# Patient Record
Sex: Male | Born: 1980 | Race: Black or African American | Hispanic: No | Marital: Married | Smoking: Never smoker
Health system: Southern US, Community
[De-identification: ages and names within clinical notes are randomized; demographics above are authoritative.]

## PROBLEM LIST (undated history)

## (undated) DIAGNOSIS — T8859XA Other complications of anesthesia, initial encounter: Secondary | ICD-10-CM

## (undated) DIAGNOSIS — B019 Varicella without complication: Secondary | ICD-10-CM

## (undated) DIAGNOSIS — T7840XA Allergy, unspecified, initial encounter: Secondary | ICD-10-CM

## (undated) DIAGNOSIS — R51 Headache: Secondary | ICD-10-CM

## (undated) HISTORY — DX: Headache: R51

## (undated) HISTORY — PX: WISDOM TOOTH EXTRACTION: SHX21

## (undated) HISTORY — DX: Varicella without complication: B01.9

## (undated) HISTORY — DX: Allergy, unspecified, initial encounter: T78.40XA

## (undated) HISTORY — PX: KNEE ARTHROSCOPY W/ ACL RECONSTRUCTION: SHX1858

---

## 2004-03-21 ENCOUNTER — Emergency Department (HOSPITAL_COMMUNITY): Admission: EM | Admit: 2004-03-21 | Discharge: 2004-03-21 | Payer: Self-pay | Admitting: Emergency Medicine

## 2004-11-06 ENCOUNTER — Encounter: Admission: RE | Admit: 2004-11-06 | Discharge: 2004-11-27 | Payer: Self-pay

## 2005-08-13 ENCOUNTER — Ambulatory Visit (HOSPITAL_COMMUNITY): Admission: RE | Admit: 2005-08-13 | Discharge: 2005-08-13 | Payer: Self-pay | Admitting: Family Medicine

## 2011-06-26 ENCOUNTER — Encounter: Payer: Self-pay | Admitting: Family Medicine

## 2011-06-26 ENCOUNTER — Ambulatory Visit (INDEPENDENT_AMBULATORY_CARE_PROVIDER_SITE_OTHER): Payer: BC Managed Care – PPO | Admitting: Family Medicine

## 2011-06-26 VITALS — BP 124/84 | HR 60 | Temp 97.7°F | Resp 12 | Ht 75.25 in | Wt 270.0 lb

## 2011-06-26 DIAGNOSIS — J45909 Unspecified asthma, uncomplicated: Secondary | ICD-10-CM

## 2011-06-26 DIAGNOSIS — M25569 Pain in unspecified knee: Secondary | ICD-10-CM

## 2011-06-26 DIAGNOSIS — Z0000 Encounter for general adult medical examination without abnormal findings: Secondary | ICD-10-CM

## 2011-06-26 DIAGNOSIS — M25562 Pain in left knee: Secondary | ICD-10-CM

## 2011-06-26 DIAGNOSIS — J4520 Mild intermittent asthma, uncomplicated: Secondary | ICD-10-CM | POA: Insufficient documentation

## 2011-06-26 LAB — HEPATIC FUNCTION PANEL
ALT: 25 U/L (ref 0–53)
AST: 20 U/L (ref 0–37)
Albumin: 4.1 g/dL (ref 3.5–5.2)
Alkaline Phosphatase: 58 U/L (ref 39–117)
Bilirubin, Direct: 0.1 mg/dL (ref 0.0–0.3)
Total Bilirubin: 1 mg/dL (ref 0.3–1.2)
Total Protein: 7.5 g/dL (ref 6.0–8.3)

## 2011-06-26 LAB — POCT URINALYSIS DIPSTICK
Bilirubin, UA: NEGATIVE
Glucose, UA: NEGATIVE
Ketones, UA: NEGATIVE
Leukocytes, UA: NEGATIVE
Nitrite, UA: NEGATIVE
Protein, UA: NEGATIVE
Spec Grav, UA: 1.02
Urobilinogen, UA: 0.2
pH, UA: 6

## 2011-06-26 LAB — LIPID PANEL
Cholesterol: 143 mg/dL (ref 0–200)
HDL: 43.4 mg/dL (ref >39.00)
LDL Cholesterol: 83 mg/dL (ref 0–99)
Total CHOL/HDL Ratio: 3
Triglycerides: 83 mg/dL (ref 0.0–149.0)
VLDL: 16.6 mg/dL (ref 0.0–40.0)

## 2011-06-26 LAB — CBC WITH DIFFERENTIAL/PLATELET
Basophils Absolute: 0 10*3/uL (ref 0.0–0.1)
Basophils Relative: 0.6 % (ref 0.0–3.0)
Eosinophils Absolute: 0.1 10*3/uL (ref 0.0–0.7)
Eosinophils Relative: 1.3 % (ref 0.0–5.0)
HCT: 47.4 % (ref 39.0–52.0)
Hemoglobin: 15.5 g/dL (ref 13.0–17.0)
Lymphocytes Relative: 31.1 % (ref 12.0–46.0)
Lymphs Abs: 1.9 10*3/uL (ref 0.7–4.0)
MCHC: 32.7 g/dL (ref 30.0–36.0)
MCV: 79.2 fl (ref 78.0–100.0)
Monocytes Absolute: 0.5 10*3/uL (ref 0.1–1.0)
Monocytes Relative: 8.5 % (ref 3.0–12.0)
Neutro Abs: 3.5 10*3/uL (ref 1.4–7.7)
Neutrophils Relative %: 58.5 % (ref 43.0–77.0)
Platelets: 220 10*3/uL (ref 150.0–400.0)
RBC: 5.98 Mil/uL — ABNORMAL HIGH (ref 4.22–5.81)
RDW: 13.5 % (ref 11.5–14.6)
WBC: 6 10*3/uL (ref 4.5–10.5)

## 2011-06-26 LAB — BASIC METABOLIC PANEL
BUN: 11 mg/dL (ref 6–23)
CO2: 26 mEq/L (ref 19–32)
Calcium: 9.3 mg/dL (ref 8.4–10.5)
Chloride: 104 mEq/L (ref 96–112)
Creatinine, Ser: 1.2 mg/dL (ref 0.4–1.5)
GFR: 97.29 mL/min (ref >60.00)
Glucose, Bld: 90 mg/dL (ref 70–99)
Potassium: 3.9 mEq/L (ref 3.5–5.1)
Sodium: 139 mEq/L (ref 135–145)

## 2011-06-26 LAB — TSH: TSH: 0.93 u[IU]/mL (ref 0.35–5.50)

## 2011-06-26 MED ORDER — ALBUTEROL SULFATE HFA 108 (90 BASE) MCG/ACT IN AERS: 2.0000 | INHALATION_SPRAY | RESPIRATORY_TRACT | Status: DC | PRN

## 2011-06-26 NOTE — Patient Instructions (Signed)
Calorie Counting Diet A calorie counting diet requires you to eat the number of calories that are right for you during a day. Calories are the measurement of how much energy you get from the food you eat. Eating the right amount of calories is important for staying at a healthy weight. If you eat too many calories your body will store them as fat and you may gain weight. If you eat too few calories you may lose weight. Counting the number of calories that you eat during a day will help you to know if you're eating the right amount. A Registered Dietitian can determine how many calories you need in a day. The amount of calories you need varies from person to person. If your goal is to lose weight you will need to eat fewer calories. Losing weight can benefit you if you are overweight or have health problems such as heart disease, high blood pressure or diabetes. If your goal is to gain weight, you will need to eat more calories. Gaining weight may be necessary if you have a certain health problem that causes your body to need more energy. TIPS Whether you are increasing or decreasing the number of calories you eat during a day, it may be hard to get used to changing what you eat and drink. The following are tips to help you keep track of the number of calories you are eating.  Measuring foods at home with measuring cups will help you to know the actual amount of food and number of calories you are eating.   Restaurants serve food in all different portion sizes. It is common that restaurants will serve food in amounts worth 2 or more serving sizes. While eating out, it may be helpful to estimate how many servings of a food you are given. For example, a serving of cooked rice is 1/2 cup and that is the size of half of a fist. Knowing serving sizes will help you have a better idea of how much food you are eating at restaurants.   Ask for smaller portion sizes or child-size portions at restaurants.   Plan to  eat half of a meal at a restaurant and take the rest home or share the other half with a friend   Read food labels for calorie content and serving size   Most packaged food has a Nutrition Facts Panel on its side or back. Here you can find out how many servings are in a package, the size of a serving, and the number of calories each serving has.   The serving size and number of servings per container are listed right below the Nutrition Facts heading. Just below the serving information, the number of calories in each serving is listed.   For example, say that a package has three cookies inside. The Nutrition Facts panel says that one serving is one cookie. Below that, it says that there are three servings in the container. The calories section of the Nutrition Facts says there are 90 calories. That means that there are 90 calories in one cookie. If you eat one cookie you have eaten 90 calories. If you eat all three cookies, you have eaten three times that amount, or 270 calories.  The list below tells you how big or small some common portion sizes are.  1 ounce (oz).................4 stacked dice.   3 oz..............................Deck of cards.   1 teaspoon (tsp)...........Tip of little finger.   1 tablespoon (Tbsp)....Tip of thumb.     2 Tbsp..........................Golf ball.    Cup..........................Half of a fist.   1 Cup...........................A fist.  KEEP A FOOD LOG Write down every food item that you eat, how much of the food you eat, and the number of calories in each food that you eat during the day. At the end of the day or throughout the day you can add up the total number of calories you have eaten.  It may help to set up a list like the one below. Find out the calorie information by reading food labels.  Breakfast   Bran Flakes (1 cup, 110 calories).   Fat free milk ( cup, 45 calories).   Snack   Apple (1 medium, 80 calories).   Lunch   Spinach (1  cup, 20 calories).   Tomato ( medium, 20 calories).   Chicken breast strips (3 oz, 165 calories).   Shredded cheddar cheese ( cup, 110 calories).   Light Italian dressing (2 Tbsp, 60 calories).   Whole wheat bread (1 slice, 80 calories).   Tub margarine (1 tsp, 35 calories).   Vegetable soup (1 cup, 160 calories).   Dinner   Pork chop (3 oz, 190 calories).   Brown rice (1 cup, 215 calories).   Steamed broccoli ( cup, 20 calories).   Strawberries (1  cup, 65 calories).   Whipped cream (1 Tbsp, 50 calories).  Daily Calorie Total: 1425 Information from www.eatright.org, Foodwise Nutritional Analysis Database. Document Released: 10/07/2005 Document Re-Released: 10/29/2009 [employer] Patient Information 2011 [employer]. 

## 2011-06-26 NOTE — Progress Notes (Signed)
Subjective:    Patient ID: David Cohen, male    DOB: 03-14-1981, 30 y.o.   MRN: [id number]  HPI New patient to establish care and for wellness visit. History of frequent headaches, possibly migraines. Mild intermittent asthma. No other chronic problems. Previous right knee reconstructive surgery 2007. Currently having some left knee issues with sense of instability with activity. Unable to run at this time. Also has frequent left knee pain poorly localized. No history of known ligament injury to left knee.  Some gradual weight gain with limitation in physical activities  Has previously taken Ventolin inhaler as needed for asthma. Allergic to penicillin.  Family history significant for aunt with gallbladder cancer.  Paternal great grandmother with esophageal cancer. Parents with hyperlipidemia  Patient is married. No children. Works in Occupational hygienist. Nonsmoker. No alcohol use.  Past Medical History  Diagnosis Date  . Asthma   . Headache   . Chicken pox   . Allergy   . Migraine    Past Surgical History  Procedure Date  . Knee arthroscopy w/ acl reconstruction 2007  . Wisdom tooth extraction     reports that he has never smoked. He does not have any smokeless tobacco history on file. His alcohol and drug histories not on file. family history includes Alcohol abuse in his maternal uncle and paternal uncle; Cancer in his maternal aunt and paternal grandfather; Heart disease in his maternal grandmother; Hyperlipidemia in his father; Hypertension in his father; Mental illness in his cousin and paternal grandmother; and Stroke in his maternal grandfather and paternal grandfather. Allergies  Allergen Reactions  . Penicillins     As a child "all cillins"      Review of Systems  Constitutional: Negative for fever, activity change, appetite change, fatigue and unexpected weight change.  HENT: Negative for ear pain, congestion and trouble swallowing.   Eyes: Negative for pain and  visual disturbance.  Respiratory: Negative for cough, shortness of breath and wheezing.   Cardiovascular: Negative for chest pain and palpitations.  Gastrointestinal: Negative for nausea, vomiting, abdominal pain, diarrhea, constipation, blood in stool, abdominal distention and rectal pain.  Genitourinary: Negative for dysuria, hematuria and testicular pain.  Musculoskeletal: Positive for arthralgias. Negative for joint swelling.  Skin: Negative for rash.  Neurological: Positive for headaches. Negative for dizziness, tremors, seizures, syncope and weakness.  Hematological: Negative for adenopathy.  Psychiatric/Behavioral: Negative for confusion and dysphoric mood.       Objective:   Physical Exam  Constitutional: He is oriented to person, place, and time. He appears well-developed and well-nourished. No distress.  HENT:  Head: Normocephalic and atraumatic.  Right Ear: External ear normal.  Left Ear: External ear normal.  Mouth/Throat: Oropharynx is clear and moist.  Eyes: Conjunctivae and EOM are normal. Pupils are equal, round, and reactive to light.  Neck: Normal range of motion. Neck supple. No thyromegaly present.  Cardiovascular: Normal rate, regular rhythm and normal heart sounds.   No murmur heard. Pulmonary/Chest: No respiratory distress. He has no wheezes. He has no rales.  Abdominal: Soft. Bowel sounds are normal. He exhibits no distension and no mass. There is no tenderness. There is no rebound and no guarding.  Musculoskeletal: He exhibits no edema.       Left knee reveals good range of motion. No warmth or erythema. No effusion. Mild medial joint line tenderness. Cruciate and collateral ligament testing reveals no instability  Lymphadenopathy:    He has no cervical adenopathy.  Neurological: He is alert and  oriented to person, place, and time. He displays normal reflexes. No cranial nerve deficit.  Skin: No rash noted.  Psychiatric: He has a normal mood and affect.           Assessment & Plan:  Complete physical. Tetanus up to date. Establish baseline labs. Refilled Ventolin for when necessary use Left knee pain with recent sense of instability. Orthopedic referral for further evaluation

## 2011-06-28 NOTE — Progress Notes (Signed)
Quick Note:  Spoke with pt - informed labs WNL ______

## 2011-07-08 ENCOUNTER — Ambulatory Visit: Payer: Self-pay | Admitting: Family Medicine

## 2011-08-13 ENCOUNTER — Other Ambulatory Visit: Payer: Self-pay | Admitting: Orthopedic Surgery

## 2011-08-13 ENCOUNTER — Encounter (INDEPENDENT_AMBULATORY_CARE_PROVIDER_SITE_OTHER): Payer: BC Managed Care – PPO | Admitting: *Deleted

## 2011-08-13 DIAGNOSIS — M79609 Pain in unspecified limb: Secondary | ICD-10-CM

## 2012-09-24 ENCOUNTER — Ambulatory Visit (INDEPENDENT_AMBULATORY_CARE_PROVIDER_SITE_OTHER): Payer: BC Managed Care – PPO | Admitting: Family Medicine

## 2012-09-24 ENCOUNTER — Encounter: Payer: Self-pay | Admitting: Family Medicine

## 2012-09-24 VITALS — BP 130/90 | Temp 97.7°F | Wt 261.0 lb

## 2012-09-24 DIAGNOSIS — R059 Cough, unspecified: Secondary | ICD-10-CM

## 2012-09-24 DIAGNOSIS — Z23 Encounter for immunization: Secondary | ICD-10-CM

## 2012-09-24 DIAGNOSIS — R05 Cough: Secondary | ICD-10-CM

## 2012-09-24 NOTE — Progress Notes (Signed)
  Subjective:    Patient ID: David Cohen, male    DOB: 03-18-1981, 31 y.o.   MRN: [id number]  HPI  Patient seen with cough for the past month. History of mild intermittent asthma. Cough is intermittent and mostly dry. No fever. No chills. Possibly some mild dyspnea with exertion. He is still exercising. He is aware of some wheezing off and on. He does not take any inhalers and has run out of albuterol. Denies postnasal drip symptoms. No GERD. No hemoptysis. No history of smoking. No pleuritic pain. No alleviating factors. Has not tried any over-the-counter medications.  Patient had reconstructive left knee surgery after last visit over one year ago. We had refer to orthopedist for unstable knee and patient states he had medial meniscal tear, patellar tear, and anterior cruciate ligament tear. Doing better at this time with stable knee   Review of Systems  Constitutional: Negative for fever and chills.  HENT: Negative for sore throat.   Respiratory: Positive for cough, shortness of breath and wheezing.   Cardiovascular: Negative for chest pain, palpitations and leg swelling.       Objective:   Physical Exam  Constitutional: He appears well-developed and well-nourished.  HENT:  Right Ear: External ear normal.  Left Ear: External ear normal.  Mouth/Throat: Oropharynx is clear and moist.  Neck: Neck supple.  Cardiovascular: Normal rate and regular rhythm.   Pulmonary/Chest: Effort normal and breath sounds normal. No respiratory distress. He has no wheezes. He has no rales.  Lymphadenopathy:    He has no cervical adenopathy.          Assessment & Plan:  Persistent cough. Patient has history of mild intermittent asthma. Question reactive airway component contributing to current cough. Probable viral trigger. No evidence for PND drip or GERD. Lung exam normal this time. Given duration of symptoms, start Qvar 80 mcg 1 puff twice a day and rinse mouth after use. Sample given for  pro-air inhaler to use as needed for rescue inhaler. Touch base 2 weeks if cough not resolving

## 2012-09-24 NOTE — Patient Instructions (Addendum)
Qvar one puff twice daily and rinse mouth after use. Touch base in 2 weeks if cough no better.

## 2013-02-22 ENCOUNTER — Ambulatory Visit (INDEPENDENT_AMBULATORY_CARE_PROVIDER_SITE_OTHER): Payer: BC Managed Care – PPO | Admitting: Family Medicine

## 2013-02-22 DIAGNOSIS — S39012A Strain of muscle, fascia and tendon of lower back, initial encounter: Secondary | ICD-10-CM

## 2013-02-22 DIAGNOSIS — S335XXA Sprain of ligaments of lumbar spine, initial encounter: Secondary | ICD-10-CM

## 2013-02-22 MED ORDER — DICLOFENAC SODIUM 75 MG PO TBEC: 75.0000 mg | DELAYED_RELEASE_TABLET | Freq: Two times a day (BID) | ORAL | Status: DC

## 2013-02-22 MED ORDER — CYCLOBENZAPRINE HCL 10 MG PO TABS: 10.0000 mg | ORAL_TABLET | Freq: Three times a day (TID) | ORAL | Status: DC | PRN

## 2013-02-22 NOTE — Patient Instructions (Addendum)

## 2013-02-22 NOTE — Progress Notes (Signed)
  Subjective:    Patient ID: David Cohen, male    DOB: 1981/08/25, 32 y.o.   MRN: [id number]  HPI Acute visit. Lower lumbar back pain. Onset yesterday after going to catch his godson who weighs about 30 pounds. Patient noted acute onset of lower lumbar middle back No radiation of pain.  No radiculopathy symptoms. 9 out 10 severity at worse. Worse with movement. Some mild pain even at rest. Took up to 800 mg ibuprofen yesterday without much improvement. Heat and made worse. Ice helped only temporarily.  Patient would see chiropractor this morning but was told he could not get any treatments because he was in such pain and also increased muscle stiffness. No previous back surgeries Denies any numbness or weakness. No urine or stool incontinence  Past Medical History  Diagnosis Date  . Asthma   . Headache   . Chicken pox   . Allergy   . Migraine    Past Surgical History  Procedure Laterality Date  . Knee arthroscopy w/ acl reconstruction  2007  . Wisdom tooth extraction      reports that he has never smoked. He does not have any smokeless tobacco history on file. His alcohol and drug histories are not on file. family history includes Alcohol abuse in his maternal uncle and paternal uncle; Cancer in his maternal aunt and paternal grandfather; Heart disease in his maternal grandmother; Hyperlipidemia in his father; Hypertension in his father; Mental illness in his cousin and paternal grandmother; and Stroke in his maternal grandfather and paternal grandfather. Allergies  Allergen Reactions  . Penicillins     As a child "all cillins"      Review of Systems  Constitutional: Negative for fever, chills, activity change and appetite change.  Respiratory: Negative for cough and shortness of breath.   Cardiovascular: Negative for chest pain and leg swelling.  Gastrointestinal: Negative for vomiting and abdominal pain.  Genitourinary: Negative for dysuria, hematuria and flank pain.   Musculoskeletal: Positive for back pain. Negative for joint swelling.  Neurological: Negative for weakness and numbness.       Objective:   Physical Exam  Constitutional: He appears well-developed and well-nourished.  Cardiovascular: Normal rate and regular rhythm.   Pulmonary/Chest: Effort normal and breath sounds normal. No respiratory distress. He has no wheezes. He has no rales.  Musculoskeletal: He exhibits no edema.  Straight leg raises are negative  Neurological:  Full-strength with plantar flexion, dorsiflexion, and knee extension bilaterally. Deep tender reflexes are 2+ knee and ankle bilaterally. Normal sensory function.          Assessment & Plan:  Acute lumbar back strain. Nonfocal exam. Diclofenac 75 mg twice daily with food. Cyclobenzaprine 10 mg Q8 hours when necessary

## 2013-11-30 ENCOUNTER — Encounter: Payer: Self-pay | Admitting: Family Medicine

## 2013-11-30 ENCOUNTER — Ambulatory Visit (INDEPENDENT_AMBULATORY_CARE_PROVIDER_SITE_OTHER): Payer: BC Managed Care – PPO | Admitting: Family Medicine

## 2013-11-30 VITALS — BP 132/80 | HR 67 | Temp 98.0°F | Wt 264.0 lb

## 2013-11-30 DIAGNOSIS — A499 Bacterial infection, unspecified: Secondary | ICD-10-CM

## 2013-11-30 DIAGNOSIS — Z23 Encounter for immunization: Secondary | ICD-10-CM

## 2013-11-30 DIAGNOSIS — H1089 Other conjunctivitis: Secondary | ICD-10-CM

## 2013-11-30 DIAGNOSIS — H109 Unspecified conjunctivitis: Secondary | ICD-10-CM

## 2013-11-30 DIAGNOSIS — B9689 Other specified bacterial agents as the cause of diseases classified elsewhere: Secondary | ICD-10-CM

## 2013-11-30 MED ORDER — POLYMYXIN B-TRIMETHOPRIM 10000-0.1 UNIT/ML-% OP SOLN: 2.0000 [drp] | OPHTHALMIC | Status: DC

## 2013-11-30 NOTE — Addendum Note (Signed)
Addended by: Thomasena EdisFLOYD, Senna Lape E on: 11/30/2013 12:37 PM   Modules accepted: Orders

## 2013-11-30 NOTE — Patient Instructions (Signed)
Conjunctivitis Conjunctivitis is commonly called "pink eye." Conjunctivitis can be caused by bacterial or viral infection, allergies, or injuries. There is usually redness of the lining of the eye, itching, discomfort, and sometimes discharge. There may be deposits of matter along the eyelids. A viral infection usually causes a watery discharge, while a bacterial infection causes a yellowish, thick discharge. Pink eye is very contagious and spreads by direct contact. You may be given antibiotic eyedrops as part of your treatment. Before using your eye medicine, remove all drainage from the eye by washing gently with warm water and cotton balls. Continue to use the medication until you have awakened 2 mornings in a row without discharge from the eye. Do not rub your eye. This increases the irritation and helps spread infection. Use separate towels from other household members. Wash your hands with soap and water before and after touching your eyes. Use cold compresses to reduce pain and sunglasses to relieve irritation from light. Do not wear contact lenses or wear eye makeup until the infection is gone. SEEK MEDICAL CARE IF:   Your symptoms are not better after 3 days of treatment.  You have increased pain or trouble seeing.  The outer eyelids become very red or swollen. Document Released: 11/14/2004 Document Revised: 12/30/2011 Document Reviewed: 10/07/2005 [facility] Patient Information 2014 [place] Park[place]Care, [place].  Use eye drops every 2 hours while awake for the first day then as directed.

## 2013-11-30 NOTE — Progress Notes (Signed)
   Subjective:    Patient ID: David Cohen, male    DOB: [birth year], 33 y.o.   MRN: [id number]  Eye Problem  Associated symptoms include an eye discharge and eye redness. Pertinent negatives include no fever or photophobia.   Acute visit Onset about 4 days ago of left eye irritation. He noticed some redness he had some crusted yellow drainage especially early in the morning. He is using warm compresses. No known injury. No blurred vision. No eye pain. He started having mild symptoms of the right eye as well. 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 No contact lens use   Review of Systems  Constitutional: Negative for fever and chills.  HENT: Negative for congestion.   Eyes: Positive for discharge, redness and itching. Negative for photophobia, pain and visual disturbance.  Neurological: Negative for headaches.       Objective:   Physical Exam  Constitutional: He appears well-developed and well-nourished.  Eyes: Pupils are equal, round, and reactive to light.  Both conjunctiva are erythematous left greater than right. No visible purulent drainage at this time. No preauricular adenopathy.  Cardiovascular: Normal rate and regular rhythm.   Pulmonary/Chest: Effort normal and breath sounds normal.          Assessment & Plan:  Bacterial conjunctivitis left eye. Continue warm compresses several times daily. Polytrim ophthalmic drops 2 drops left eye every 2 hours while awake today then decrease to every 4 hours while awake. Touch base in 2-3 days if not improving

## 2013-11-30 NOTE — Progress Notes (Signed)
Pre visit review using our clinic review tool, if applicable. No additional management support is needed unless otherwise documented below in the visit note. 

## 2014-02-07 ENCOUNTER — Ambulatory Visit (INDEPENDENT_AMBULATORY_CARE_PROVIDER_SITE_OTHER): Payer: BC Managed Care – PPO | Admitting: Family Medicine

## 2014-02-07 DIAGNOSIS — Z111 Encounter for screening for respiratory tuberculosis: Secondary | ICD-10-CM

## 2014-02-09 ENCOUNTER — Encounter: Payer: Self-pay | Admitting: Family Medicine

## 2014-02-09 ENCOUNTER — Ambulatory Visit (INDEPENDENT_AMBULATORY_CARE_PROVIDER_SITE_OTHER): Payer: BC Managed Care – PPO | Admitting: Family Medicine

## 2014-02-09 VITALS — BP 130/80 | HR 74 | Temp 98.0°F | Ht 75.0 in | Wt 267.0 lb

## 2014-02-09 DIAGNOSIS — Z0000 Encounter for general adult medical examination without abnormal findings: Secondary | ICD-10-CM

## 2014-02-09 DIAGNOSIS — E669 Obesity, unspecified: Secondary | ICD-10-CM | POA: Insufficient documentation

## 2014-02-09 LAB — BASIC METABOLIC PANEL
BUN: 11 mg/dL (ref 6–23)
CO2: 25 mEq/L (ref 19–32)
Calcium: 9.8 mg/dL (ref 8.4–10.5)
Chloride: 102 mEq/L (ref 96–112)
Creatinine, Ser: 1.2 mg/dL (ref 0.4–1.5)
GFR: 89.24 mL/min (ref >60.00)
Glucose, Bld: 87 mg/dL (ref 70–99)
Potassium: 3.9 mEq/L (ref 3.5–5.1)
Sodium: 136 mEq/L (ref 135–145)

## 2014-02-09 LAB — CBC WITH DIFFERENTIAL/PLATELET
Basophils Absolute: 0 10*3/uL (ref 0.0–0.1)
Basophils Relative: 0.1 % (ref 0.0–3.0)
Eosinophils Absolute: 0.1 10*3/uL (ref 0.0–0.7)
Eosinophils Relative: 1.2 % (ref 0.0–5.0)
HCT: 48.8 % (ref 39.0–52.0)
Hemoglobin: 16.1 g/dL (ref 13.0–17.0)
Lymphocytes Relative: 31.9 % (ref 12.0–46.0)
Lymphs Abs: 2.2 10*3/uL (ref 0.7–4.0)
MCHC: 33.1 g/dL (ref 30.0–36.0)
MCV: 78.6 fl (ref 78.0–100.0)
Monocytes Absolute: 0.4 10*3/uL (ref 0.1–1.0)
Monocytes Relative: 6.1 % (ref 3.0–12.0)
Neutro Abs: 4.2 10*3/uL (ref 1.4–7.7)
Neutrophils Relative %: 60.7 % (ref 43.0–77.0)
Platelets: 249 10*3/uL (ref 150.0–400.0)
RBC: 6.21 Mil/uL — ABNORMAL HIGH (ref 4.22–5.81)
RDW: 13.8 % (ref 11.5–14.6)
WBC: 6.9 10*3/uL (ref 4.5–10.5)

## 2014-02-09 LAB — HEPATIC FUNCTION PANEL
ALT: 21 U/L (ref 0–53)
AST: 19 U/L (ref 0–37)
Albumin: 4.2 g/dL (ref 3.5–5.2)
Alkaline Phosphatase: 70 U/L (ref 39–117)
Bilirubin, Direct: 0 mg/dL (ref 0.0–0.3)
Total Bilirubin: 1 mg/dL (ref 0.3–1.2)
Total Protein: 8.3 g/dL (ref 6.0–8.3)

## 2014-02-09 LAB — TB SKIN TEST
Induration: 0 mm
TB Skin Test: NEGATIVE

## 2014-02-09 LAB — LIPID PANEL
Cholesterol: 158 mg/dL (ref 0–200)
HDL: 45.3 mg/dL (ref >39.00)
LDL Cholesterol: 102 mg/dL — ABNORMAL HIGH (ref 0–99)
Total CHOL/HDL Ratio: 3
Triglycerides: 54 mg/dL (ref 0.0–149.0)
VLDL: 10.8 mg/dL (ref 0.0–40.0)

## 2014-02-09 LAB — TSH: TSH: 0.64 u[IU]/mL (ref 0.35–5.50)

## 2014-02-09 NOTE — Progress Notes (Signed)
Pre visit review using our clinic review tool, if applicable. No additional management support is needed unless otherwise documented below in the visit note. 

## 2014-02-09 NOTE — Progress Notes (Signed)
   Subjective:    Patient ID: David Cohen, male    DOB: Jun 24, 1981, 33 y.o.   MRN: [id number]  HPI Patient seen for complete physical. 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 Takes no regular medications. History of mild intermittent asthma. Tetanus up-to-date. Nonsmoker. Exercises regularly. He is attempting to lose weight. He is frustrated that he has some difficulty losing abdominal obesity.  Past Medical History  Diagnosis Date  . Asthma   . Headache(784.0)   . Chicken pox   . Allergy   . Migraine    Past Surgical History  Procedure Laterality Date  . Knee arthroscopy w/ acl reconstruction  2007  . Wisdom tooth extraction      reports that he has never smoked. He does not have any smokeless tobacco history on file. His alcohol and drug histories are not on file. family history includes Alcohol abuse in his maternal uncle and paternal uncle; Cancer in his maternal aunt and paternal grandfather; Heart disease in his maternal grandmother; Hyperlipidemia in his father; Hypertension in his father; Mental illness in his cousin and paternal grandmother; Stroke in his maternal grandfather and paternal grandfather. Allergies  Allergen Reactions  . Penicillins     As a child "all cillins"      Review of Systems  Constitutional: Negative for fever, activity change, appetite change and fatigue.  HENT: Negative for congestion, ear pain and trouble swallowing.   Eyes: Negative for pain and visual disturbance.  Respiratory: Negative for cough, shortness of breath and wheezing.   Cardiovascular: Negative for chest pain and palpitations.  Gastrointestinal: Negative for nausea, vomiting, abdominal pain, diarrhea, constipation, blood in stool, abdominal distention and rectal pain.  Genitourinary: Negative for dysuria, hematuria and testicular pain.  Musculoskeletal: Negative for arthralgias and joint swelling.  Skin: Negative for rash.  Neurological: Negative for dizziness,  syncope and headaches.  Hematological: Negative for adenopathy.  Psychiatric/Behavioral: Negative for confusion and dysphoric mood.       Objective:   Physical Exam  Constitutional: He is oriented to person, place, and time. He appears well-developed and well-nourished.  HENT:  Right Ear: External ear normal.  Left Ear: External ear normal.  Mouth/Throat: Oropharynx is clear and moist.  Neck: Neck supple. No thyromegaly present.  Cardiovascular: Normal rate and regular rhythm.   Pulmonary/Chest: Effort normal and breath sounds normal. No respiratory distress. He has no wheezes. He has no rales.  Musculoskeletal: He exhibits no edema.  Lymphadenopathy:    He has no cervical adenopathy.  Neurological: He is alert and oriented to person, place, and time. No cranial nerve deficit.  Psychiatric: He has a normal mood and affect. His behavior is normal.          Assessment & Plan:   complete physical. Schedule screening labs. Patient's fasting today. Tetanus up-to-date. Continue with regular aerobic activity. Forms completed for substitute teaching. Recent PPD negative

## 2014-02-10 ENCOUNTER — Encounter: Payer: BC Managed Care – PPO | Admitting: Family Medicine

## 2015-04-04 ENCOUNTER — Encounter: Payer: Self-pay | Admitting: Family Medicine

## 2015-04-04 ENCOUNTER — Ambulatory Visit (INDEPENDENT_AMBULATORY_CARE_PROVIDER_SITE_OTHER): Payer: BLUE CROSS/BLUE SHIELD | Admitting: Family Medicine

## 2015-04-04 VITALS — BP 130/80 | HR 70 | Temp 97.7°F | Ht 75.2 in | Wt 271.0 lb

## 2015-04-04 DIAGNOSIS — Z0000 Encounter for general adult medical examination without abnormal findings: Secondary | ICD-10-CM

## 2015-04-04 LAB — LIPID PANEL
Cholesterol: 153 mg/dL (ref 0–200)
HDL: 41.5 mg/dL (ref >39.00)
LDL Cholesterol: 99 mg/dL (ref 0–99)
NonHDL: 111.5
Total CHOL/HDL Ratio: 4
Triglycerides: 63 mg/dL (ref 0.0–149.0)
VLDL: 12.6 mg/dL (ref 0.0–40.0)

## 2015-04-04 LAB — BASIC METABOLIC PANEL
BUN: 10 mg/dL (ref 6–23)
CO2: 30 mEq/L (ref 19–32)
Calcium: 9.6 mg/dL (ref 8.4–10.5)
Chloride: 104 mEq/L (ref 96–112)
Creatinine, Ser: 1.31 mg/dL (ref 0.40–1.50)
GFR: 81.6 mL/min (ref >60.00)
Glucose, Bld: 89 mg/dL (ref 70–99)
Potassium: 4 mEq/L (ref 3.5–5.1)
Sodium: 137 mEq/L (ref 135–145)

## 2015-04-04 LAB — CBC WITH DIFFERENTIAL/PLATELET
Basophils Absolute: 0 10*3/uL (ref 0.0–0.1)
Basophils Relative: 0.5 % (ref 0.0–3.0)
Eosinophils Absolute: 0.1 10*3/uL (ref 0.0–0.7)
Eosinophils Relative: 1.4 % (ref 0.0–5.0)
HCT: 47.7 % (ref 39.0–52.0)
Hemoglobin: 15.6 g/dL (ref 13.0–17.0)
Lymphocytes Relative: 35.3 % (ref 12.0–46.0)
Lymphs Abs: 1.9 10*3/uL (ref 0.7–4.0)
MCHC: 32.8 g/dL (ref 30.0–36.0)
MCV: 77.3 fl — ABNORMAL LOW (ref 78.0–100.0)
Monocytes Absolute: 0.4 10*3/uL (ref 0.1–1.0)
Monocytes Relative: 7.8 % (ref 3.0–12.0)
Neutro Abs: 3 10*3/uL (ref 1.4–7.7)
Neutrophils Relative %: 55 % (ref 43.0–77.0)
Platelets: 246 10*3/uL (ref 150.0–400.0)
RBC: 6.18 Mil/uL — ABNORMAL HIGH (ref 4.22–5.81)
RDW: 14.3 % (ref 11.5–15.5)
WBC: 5.5 10*3/uL (ref 4.0–10.5)

## 2015-04-04 LAB — HEPATIC FUNCTION PANEL
ALT: 20 U/L (ref 0–53)
AST: 20 U/L (ref 0–37)
Albumin: 4.3 g/dL (ref 3.5–5.2)
Alkaline Phosphatase: 64 U/L (ref 39–117)
Bilirubin, Direct: 0.1 mg/dL (ref 0.0–0.3)
Total Bilirubin: 0.9 mg/dL (ref 0.2–1.2)
Total Protein: 7.5 g/dL (ref 6.0–8.3)

## 2015-04-04 LAB — TSH: TSH: 1.65 u[IU]/mL (ref 0.35–4.50)

## 2015-04-04 MED ORDER — ALBUTEROL SULFATE HFA 108 (90 BASE) MCG/ACT IN AERS: 2.0000 | INHALATION_SPRAY | RESPIRATORY_TRACT | Status: DC | PRN

## 2015-04-04 NOTE — Progress Notes (Signed)
   Subjective:    Patient ID: David Cohen, male    DOB: 04/25/1981, 34 y.o.   MRN: [id number]  HPI  Patient here for complete physical. Generally very healthy. He exercises with basketball few times per week. He is attempting to lose some weight. Tetanus up-to-date. Family history significant for father with prediabetes and mother with type 2 diabetes. Grandfather had prostate cancer. He is a nonsmoker  Past Medical History  Diagnosis Date  . Asthma   . Headache(784.0)   . Chicken pox   . Allergy   . Migraine    Past Surgical History  Procedure Laterality Date  . Knee arthroscopy w/ acl reconstruction  2007  . Wisdom tooth extraction      reports that he has never smoked. He does not have any smokeless tobacco history on file. His alcohol and drug histories are not on file. family history includes Alcohol abuse in his maternal uncle and paternal uncle; Cancer in his maternal aunt and paternal grandfather; Heart disease in his maternal grandmother; Hyperlipidemia in his father; Hypertension in his father; Mental illness in his cousin and paternal grandmother; Stroke in his maternal grandfather and paternal grandfather. Allergies  Allergen Reactions  . Penicillins     As a child "all cillins"     Review of Systems  Constitutional: Negative for fever, activity change, appetite change, fatigue and unexpected weight change.  HENT: Negative for congestion, ear pain and trouble swallowing.   Eyes: Negative for pain and visual disturbance.  Respiratory: Negative for cough, shortness of breath and wheezing.   Cardiovascular: Negative for chest pain and palpitations.  Gastrointestinal: Negative for nausea, vomiting, abdominal pain, diarrhea, constipation, blood in stool, abdominal distention and rectal pain.  Endocrine: Negative for polydipsia and polyuria.  Genitourinary: Negative for dysuria, hematuria and testicular pain.  Musculoskeletal: Negative for joint swelling and  arthralgias.  Skin: Negative for rash.  Neurological: Negative for dizziness, syncope and headaches.  Hematological: Negative for adenopathy.  Psychiatric/Behavioral: Negative for confusion and dysphoric mood.       Objective:   Physical Exam  Constitutional: He is oriented to person, place, and time. He appears well-developed and well-nourished. No distress.  HENT:  Head: Normocephalic and atraumatic.  Right Ear: External ear normal.  Left Ear: External ear normal.  Mouth/Throat: Oropharynx is clear and moist.  Eyes: Conjunctivae and EOM are normal. Pupils are equal, round, and reactive to light.  Neck: Normal range of motion. Neck supple. No thyromegaly present.  Cardiovascular: Normal rate, regular rhythm and normal heart sounds.   No murmur heard. Pulmonary/Chest: No respiratory distress. He has no wheezes. He has no rales.  Abdominal: Soft. Bowel sounds are normal. He exhibits no distension and no mass. There is no tenderness. There is no rebound and no guarding.  Musculoskeletal: He exhibits no edema.  Lymphadenopathy:    He has no cervical adenopathy.  Neurological: He is alert and oriented to person, place, and time. He displays normal reflexes. No cranial nerve deficit.  Skin: No rash noted.  Psychiatric: He has a normal mood and affect.          Assessment & Plan:  Complete physical. Obtain screening labs. Tetanus up-to-date. He is encouraged to lose some weight.

## 2015-04-04 NOTE — Progress Notes (Signed)
Pre visit review using our clinic review tool, if applicable. No additional management support is needed unless otherwise documented below in the visit note. 

## 2016-02-06 ENCOUNTER — Ambulatory Visit (INDEPENDENT_AMBULATORY_CARE_PROVIDER_SITE_OTHER): Payer: 59 | Admitting: Adult Health

## 2016-02-06 ENCOUNTER — Encounter: Payer: Self-pay | Admitting: Adult Health

## 2016-02-06 VITALS — BP 130/94 | HR 68 | Temp 98.1°F | Wt 269.6 lb

## 2016-02-06 DIAGNOSIS — J4541 Moderate persistent asthma with (acute) exacerbation: Secondary | ICD-10-CM

## 2016-02-06 MED ORDER — PREDNISONE 10 MG PO TABS: ORAL_TABLET | ORAL | Status: DC

## 2016-02-06 MED ORDER — IPRATROPIUM-ALBUTEROL 0.5-2.5 (3) MG/3ML IN SOLN: 3.0000 mL | Freq: Once | RESPIRATORY_TRACT | Status: AC

## 2016-02-06 MED ORDER — FLUTICASONE FUROATE-VILANTEROL 100-25 MCG/INH IN AEPB: 1.0000 | INHALATION_SPRAY | Freq: Every day | RESPIRATORY_TRACT | Status: DC

## 2016-02-06 MED ORDER — IPRATROPIUM-ALBUTEROL 0.5-2.5 (3) MG/3ML IN SOLN
3.0000 mL | Freq: Four times a day (QID) | RESPIRATORY_TRACT | Status: DC
  Administered 2016-02-06: 3 mL via RESPIRATORY_TRACT

## 2016-02-06 NOTE — Progress Notes (Signed)
Pre visit review using our clinic review tool, if applicable. No additional management support is needed unless otherwise documented below in the visit note. 

## 2016-02-06 NOTE — Patient Instructions (Signed)
It was great meeting you today!  I have sent in a prescription for prednisone as well as a Breo Ellipta inhaler.   Take the prednisone as directed   40 mg x 3 days, 20 mg x 3 days, 10 mg x 3 days  Use the inhaler once per day   You can take Mucinex as needed for cough   Follow up if no improvement.

## 2016-02-06 NOTE — Progress Notes (Signed)
Subjective:    Patient ID: David Cohen, male    DOB: 1980/10/23, 35 y.o.   MRN: [id number]  HPI  35 year old male who presents to the office today for asthma flare type symptoms. He reports that for the last five days he has had dry cough, wheezing, and hoarse voice. The wheezing is worse in the evening and when he wakes up.  He has been using his albuterol inhaler greater than 4 times a day and does not feel like this is enough.   Review of Systems  Constitutional: Positive for activity change. Negative for fever, chills and diaphoresis.  HENT: Positive for voice change.   Respiratory: Positive for cough, shortness of breath and wheezing.   Cardiovascular: Negative.   Neurological: Negative.   Psychiatric/Behavioral: Positive for sleep disturbance.  All other systems reviewed and are negative.  Past Medical History  Diagnosis Date  . Asthma   . Headache(784.0)   . Chicken pox   . Allergy   . Migraine     Social History   Social History  . Marital Status: Married    Spouse Name: N/A  . Number of Children: N/A  . Years of Education: N/A   Occupational History  . Not on file.   Social History Main Topics  . Smoking status: Never Smoker   . Smokeless tobacco: Not on file  . Alcohol Use: Not on file  . Drug Use: Not on file  . Sexual Activity: Not on file   Other Topics Concern  . Not on file   Social History Narrative    Past Surgical History  Procedure Laterality Date  . Knee arthroscopy w/ acl reconstruction  2007  . Wisdom tooth extraction      Family History  Problem Relation Age of Onset  . Hyperlipidemia Father   . Hypertension Father   . Cancer Maternal Aunt     gall bladder  . Alcohol abuse Maternal Uncle   . Alcohol abuse Paternal Uncle   . Heart disease Maternal Grandmother   . Stroke Maternal Grandfather   . Mental illness Paternal Grandmother   . Cancer Paternal Grandfather     colon  . Stroke Paternal Grandfather   . Mental  illness Cousin     Allergies  Allergen Reactions  . Penicillins     As a child "all cillins"    Current Outpatient Prescriptions on File Prior to Visit  Medication Sig Dispense Refill  . albuterol (PROVENTIL HFA;VENTOLIN HFA) 108 (90 BASE) MCG/ACT inhaler Inhale 2 puffs into the lungs every 4 (four) hours as needed. 1 Inhaler 1   No current facility-administered medications on file prior to visit.    BP 130/94 mmHg  Pulse 68  Temp(Src) 98.1 F (36.7 C) (Oral)  Wt 269 lb 9.6 oz (122.29 kg)  SpO2 97%       Objective:   Physical Exam  Constitutional: He is oriented to person, place, and time. He appears well-developed and well-nourished. No distress.  Cardiovascular: Normal rate, regular rhythm, normal heart sounds and intact distal pulses.  Exam reveals no gallop and no friction rub.   No murmur heard. Pulmonary/Chest: Effort normal. No respiratory distress. He has decreased breath sounds in the right lower field and the left lower field. He has wheezes (trace throughout). He has no rales. He exhibits no tenderness.  Neurological: He is alert and oriented to person, place, and time.  Skin: Skin is warm and dry. No rash noted. He is  not diaphoretic. No erythema. No pallor.  Psychiatric: He has a normal mood and affect. His behavior is normal. Judgment and thought content normal.  Nursing note and vitals reviewed.     Assessment & Plan:  1. Asthma with exacerbation, moderate persistent - predniSONE (DELTASONE) 10 MG tablet; 40 mg x 3 days, 20 mg x 3 days, 10 mg x 3 days  Dispense: 21 tablet; Refill: 0 - fluticasone furoate-vilanterol (BREO ELLIPTA) 100-25 MCG/INH AEPB; Inhale 1 puff into the lungs daily.  Dispense: 28 each; Refill: 6 - ipratropium-albuterol (DUONEB) 0.5-2.5 (3) MG/3ML nebulizer solution 3 mL; Take 3 mLs by nebulization every 6 (six) hours. - He reports that he feels as though he is breathing better after nebulizer.  - On exam, he is moving air much easier,  wheezing has diminished.   Shirline Frees, NP

## 2016-02-06 NOTE — Addendum Note (Signed)
Addended by: Janelle FloorHOMPSON, MONICA B on: 02/06/2016 02:51 PM   Modules accepted: Orders

## 2016-02-09 ENCOUNTER — Telehealth: Payer: Self-pay | Admitting: Family Medicine

## 2016-02-09 NOTE — Telephone Encounter (Signed)
FYI

## 2016-02-09 NOTE — Telephone Encounter (Signed)
[facility] Day - Client TELEPHONE ADVICE RECORD [facility] Patient Name: Janet BerlinJOSHUA Rider DOB: 1981/03/22 Initial Comment Caller states was in earlier in the week and put on meds; has dental appt next week; interaction between steroid and numbing agent. no sx; office xferred to triage; he is calling dentist to see if can get the name of the numbing agent Nurse Assessment Nurse: Debera Latalston, RN, Tinnie GensJeffrey Date/Time Lamount Cohen(Eastern Time): 02/09/2016 2:06:39 PM Confirm and document reason for call. If symptomatic, describe symptoms. You must click the next button to save text entered. ---Caller states was in earlier in the week and put on meds; has dental appt next week; interaction between steroid and numbing agent. no sx; office xferred to triage; he is calling dentist to see if can get the name of the numbing agent. Lidocaine. Has the patient traveled out of the country within the last 30 days? ---No Does the patient have any new or worsening symptoms? ---No Please document clinical information provided and list any resource used. ---Drug Interaction Report Drug interactions for the following 2 drug(s): lidocaine prednisone Interactions between your selected drugs No results found - however, this does not necessarily mean no interactions exist. Always consult with your doctor or pharmacist. Per Drugs.com Guidelines Guideline Title Affirmed Question Affirmed Notes Final Disposition User Clinical Call MidlandRalston, RN, Tinnie GensJeffrey

## 2016-12-13 ENCOUNTER — Ambulatory Visit (INDEPENDENT_AMBULATORY_CARE_PROVIDER_SITE_OTHER): Payer: 59 | Admitting: Family Medicine

## 2016-12-13 ENCOUNTER — Encounter: Payer: Self-pay | Admitting: Family Medicine

## 2016-12-13 VITALS — BP 110/80 | HR 70 | Ht 75.25 in | Wt 260.0 lb

## 2016-12-13 DIAGNOSIS — Z0000 Encounter for general adult medical examination without abnormal findings: Secondary | ICD-10-CM | POA: Diagnosis not present

## 2016-12-13 LAB — HEPATIC FUNCTION PANEL
ALT: 19 U/L (ref 0–53)
AST: 16 U/L (ref 0–37)
Albumin: 4.2 g/dL (ref 3.5–5.2)
Alkaline Phosphatase: 68 U/L (ref 39–117)
Bilirubin, Direct: 0.2 mg/dL (ref 0.0–0.3)
Total Bilirubin: 0.8 mg/dL (ref 0.2–1.2)
Total Protein: 7.2 g/dL (ref 6.0–8.3)

## 2016-12-13 LAB — BASIC METABOLIC PANEL
BUN: 12 mg/dL (ref 6–23)
CO2: 26 mEq/L (ref 19–32)
Calcium: 9.6 mg/dL (ref 8.4–10.5)
Chloride: 107 mEq/L (ref 96–112)
Creatinine, Ser: 1.15 mg/dL (ref 0.40–1.50)
GFR: 93.84 mL/min (ref >60.00)
Glucose, Bld: 88 mg/dL (ref 70–99)
Potassium: 4.4 mEq/L (ref 3.5–5.1)
Sodium: 139 mEq/L (ref 135–145)

## 2016-12-13 LAB — CBC WITH DIFFERENTIAL/PLATELET
Basophils Absolute: 0 10*3/uL (ref 0.0–0.1)
Basophils Relative: 0.5 % (ref 0.0–3.0)
Eosinophils Absolute: 0.1 10*3/uL (ref 0.0–0.7)
Eosinophils Relative: 1.6 % (ref 0.0–5.0)
HCT: 45.6 % (ref 39.0–52.0)
Hemoglobin: 15.5 g/dL (ref 13.0–17.0)
Lymphocytes Relative: 35.1 % (ref 12.0–46.0)
Lymphs Abs: 1.7 10*3/uL (ref 0.7–4.0)
MCHC: 34.1 g/dL (ref 30.0–36.0)
MCV: 75.4 fl — ABNORMAL LOW (ref 78.0–100.0)
Monocytes Absolute: 0.4 10*3/uL (ref 0.1–1.0)
Monocytes Relative: 7.4 % (ref 3.0–12.0)
Neutro Abs: 2.6 10*3/uL (ref 1.4–7.7)
Neutrophils Relative %: 55.4 % (ref 43.0–77.0)
Platelets: 243 10*3/uL (ref 150.0–400.0)
RBC: 6.04 Mil/uL — ABNORMAL HIGH (ref 4.22–5.81)
RDW: 14 % (ref 11.5–15.5)
WBC: 4.7 10*3/uL (ref 4.0–10.5)

## 2016-12-13 LAB — LIPID PANEL
Cholesterol: 142 mg/dL (ref 0–200)
HDL: 43.5 mg/dL (ref >39.00)
LDL Cholesterol: 83 mg/dL (ref 0–99)
NonHDL: 98.06
Total CHOL/HDL Ratio: 3
Triglycerides: 75 mg/dL (ref 0.0–149.0)
VLDL: 15 mg/dL (ref 0.0–40.0)

## 2016-12-13 LAB — TSH: TSH: 0.9 u[IU]/mL (ref 0.35–4.50)

## 2016-12-13 NOTE — Progress Notes (Signed)
Subjective:     Patient ID: David Cohen, male   DOB: Aug 18, 1981, 36 y.o.   MRN: [id number]  HPI Patient seen for physical exam. He takes no medications. He has no chronic medical problems. He has positive family history of type 2 diabetes in his mother and prediabetes in his father. He has lost some weight since last visit. He has been trying to exercise more regularly. Hopes to lose 20-30 more pounds. Never smoked. Tetanus up-to-date.  Past Medical History:  Diagnosis Date  . Allergy   . Asthma   . Chicken pox   . Headache(784.0)   . Migraine    Past Surgical History:  Procedure Laterality Date  . KNEE ARTHROSCOPY W/ ACL RECONSTRUCTION  2007  . WISDOM TOOTH EXTRACTION      reports that he has never smoked. He has never used smokeless tobacco. He reports that he does not drink alcohol or use drugs. family history includes Alcohol abuse in his maternal uncle and paternal uncle; Cancer in his maternal aunt and paternal grandfather; Heart disease in his maternal grandmother; Hyperlipidemia in his father; Hypertension in his father; Mental illness in his cousin and paternal grandmother; Stroke in his maternal grandfather and paternal grandfather. Allergies  Allergen Reactions  . Penicillins     As a child "all cillins"     Review of Systems  Constitutional: Negative for activity change, appetite change, fatigue and fever.  HENT: Negative for congestion, ear pain and trouble swallowing.   Eyes: Negative for pain and visual disturbance.  Respiratory: Negative for cough, shortness of breath and wheezing.   Cardiovascular: Negative for chest pain and palpitations.  Gastrointestinal: Negative for abdominal distention, abdominal pain, blood in stool, constipation, diarrhea, nausea, rectal pain and vomiting.  Genitourinary: Negative for dysuria, hematuria and testicular pain.  Musculoskeletal: Negative for arthralgias and joint swelling.  Skin: Negative for rash.  Neurological:  Negative for dizziness, syncope and headaches.  Hematological: Negative for adenopathy.  Psychiatric/Behavioral: Negative for confusion and dysphoric mood.       Objective:   Physical Exam  Constitutional: He is oriented to person, place, and time. He appears well-developed and well-nourished. No distress.  HENT:  Head: Normocephalic and atraumatic.  Right Ear: External ear normal.  Left Ear: External ear normal.  Mouth/Throat: Oropharynx is clear and moist.  Eyes: Conjunctivae and EOM are normal. Pupils are equal, round, and reactive to light.  Neck: Normal range of motion. Neck supple. No thyromegaly present.  Cardiovascular: Normal rate, regular rhythm and normal heart sounds.   No murmur heard. Pulmonary/Chest: No respiratory distress. He has no wheezes. He has no rales.  Abdominal: Soft. Bowel sounds are normal. He exhibits no distension and no mass. There is no tenderness. There is no rebound and no guarding.  Musculoskeletal: He exhibits no edema.  Lymphadenopathy:    He has no cervical adenopathy.  Neurological: He is alert and oriented to person, place, and time. He displays normal reflexes. No cranial nerve deficit.  Skin: No rash noted.  Psychiatric: He has a normal mood and affect.       Assessment:     Physical exam. Positive family history of type 2 diabetes as above    Plan:     -continue weight loss efforts and consistent exercise -Obtain screening lab work today -Discussed importance of low glycemic diet  Kristian CoveyBruce W Nolie Bignell MD [facility]

## 2016-12-13 NOTE — Patient Instructions (Signed)
Continue with weight loss efforts We will call you with lab results.

## 2017-01-01 DIAGNOSIS — S83241A Other tear of medial meniscus, current injury, right knee, initial encounter: Secondary | ICD-10-CM | POA: Diagnosis not present

## 2017-01-06 DIAGNOSIS — M25561 Pain in right knee: Secondary | ICD-10-CM | POA: Diagnosis not present

## 2017-01-09 DIAGNOSIS — S83241D Other tear of medial meniscus, current injury, right knee, subsequent encounter: Secondary | ICD-10-CM | POA: Diagnosis not present

## 2017-01-22 DIAGNOSIS — M94261 Chondromalacia, right knee: Secondary | ICD-10-CM | POA: Diagnosis not present

## 2017-01-22 DIAGNOSIS — S83241A Other tear of medial meniscus, current injury, right knee, initial encounter: Secondary | ICD-10-CM | POA: Diagnosis not present

## 2017-01-22 DIAGNOSIS — S83281A Other tear of lateral meniscus, current injury, right knee, initial encounter: Secondary | ICD-10-CM | POA: Diagnosis not present

## 2017-01-22 DIAGNOSIS — G8918 Other acute postprocedural pain: Secondary | ICD-10-CM | POA: Diagnosis not present

## 2017-02-20 ENCOUNTER — Ambulatory Visit (HOSPITAL_COMMUNITY)
Admission: RE | Admit: 2017-02-20 | Discharge: 2017-02-20 | Disposition: A | Discharge status: Home / Self Care | Payer: 59 | Source: Ambulatory Visit | Attending: Family Medicine | Admitting: Family Medicine

## 2017-02-20 ENCOUNTER — Other Ambulatory Visit (HOSPITAL_COMMUNITY): Payer: Self-pay | Admitting: Orthopedic Surgery

## 2017-02-20 DIAGNOSIS — M7989 Other specified soft tissue disorders: Secondary | ICD-10-CM | POA: Insufficient documentation

## 2017-02-20 DIAGNOSIS — M79661 Pain in right lower leg: Secondary | ICD-10-CM

## 2017-02-20 DIAGNOSIS — M79604 Pain in right leg: Secondary | ICD-10-CM | POA: Diagnosis not present

## 2017-02-20 NOTE — Progress Notes (Signed)
**  Preliminary report by tech**  Right lower extremity venous duplex complete. There is no evidence of deep or superficial vein thrombosis involving the right lower extremity. All visualized veins appear patent and compressible. There is no evidence of a Baker's cyst on the right. Results were given to Dr. Thurston HoleWainer.  02/20/17 3:29 PM David CordialGreg Aniketh Huberty RVT

## 2017-07-10 ENCOUNTER — Encounter: Payer: Self-pay | Admitting: Family Medicine

## 2018-05-06 ENCOUNTER — Ambulatory Visit (INDEPENDENT_AMBULATORY_CARE_PROVIDER_SITE_OTHER): Payer: 59 | Admitting: Family Medicine

## 2018-05-06 ENCOUNTER — Encounter: Payer: Self-pay | Admitting: Family Medicine

## 2018-05-06 VITALS — BP 120/80 | HR 73 | Temp 98.1°F | Ht 75.0 in | Wt 275.8 lb

## 2018-05-06 DIAGNOSIS — Z0000 Encounter for general adult medical examination without abnormal findings: Secondary | ICD-10-CM | POA: Diagnosis not present

## 2018-05-06 NOTE — Progress Notes (Signed)
Subjective:     Patient ID: David BottomJoshua K. Zelman, male   DOB: [birth year], 37 y.o.   MRN: [id number]  HPI  Patient seen for physical exam. He had very stressful year. He had a brother that committed suicide July 5. He's just gotten back from that funeral. This has naturally been very stressful for him. He and his wife are expecting twins next January. He has a 35-year-old at home now.  He has gained some weight over the past year which he attributes to stress. He hopes to lose more weight soon. He's already lost about 7 pounds over the past few weeks by dietary changes. He's starting to exercise more.  Nonsmoker. Tetanus up-to-date.  Past Medical History:  Diagnosis Date  . Allergy   . Asthma   . Chicken pox   . Headache(784.0)   . Migraine    Past Surgical History:  Procedure Laterality Date  . KNEE ARTHROSCOPY W/ ACL RECONSTRUCTION  2007  . WISDOM TOOTH EXTRACTION      reports that he has never smoked. He has never used smokeless tobacco. He reports that he does not drink alcohol or use drugs. family history includes Alcohol abuse in his maternal uncle and paternal uncle; Cancer in his maternal aunt and paternal grandfather; Heart disease in his maternal grandmother; Hyperlipidemia in his father; Hypertension in his father; Mental illness in his cousin and paternal grandmother; Stroke in his maternal grandfather and paternal grandfather. Allergies  Allergen Reactions  . Penicillins     As a child "all cillins"    Review of Systems  Constitutional: Negative for activity change, appetite change, fatigue and fever.  HENT: Negative for congestion, ear pain and trouble swallowing.   Eyes: Negative for pain and visual disturbance.  Respiratory: Negative for cough, shortness of breath and wheezing.   Cardiovascular: Negative for chest pain and palpitations.  Gastrointestinal: Negative for abdominal distention, abdominal pain, blood in stool, constipation, diarrhea, nausea, rectal pain and  vomiting.  Genitourinary: Negative for dysuria, hematuria and testicular pain.  Musculoskeletal: Negative for arthralgias and joint swelling.  Skin: Negative for rash.  Neurological: Negative for dizziness, syncope and headaches.  Hematological: Negative for adenopathy.  Psychiatric/Behavioral: Negative for confusion and dysphoric mood.       Objective:   Physical Exam  Constitutional: He is oriented to person, place, and time. He appears well-developed and well-nourished. No distress.  HENT:  Head: Normocephalic and atraumatic.  Right Ear: External ear normal.  Left Ear: External ear normal.  Mouth/Throat: Oropharynx is clear and moist.  Eyes: Pupils are equal, round, and reactive to light. Conjunctivae and EOM are normal.  Neck: Normal range of motion. Neck supple. No thyromegaly present.  Cardiovascular: Normal rate, regular rhythm and normal heart sounds.  No murmur heard. Pulmonary/Chest: No respiratory distress. He has no wheezes. He has no rales.  Abdominal: Soft. Bowel sounds are normal. He exhibits no distension and no mass. There is no tenderness. There is no rebound and no guarding.  Musculoskeletal: He exhibits no edema.  Lymphadenopathy:    He has no cervical adenopathy.  Neurological: He is alert and oriented to person, place, and time. He displays normal reflexes. No cranial nerve deficit.  Skin: No rash noted.  Psychiatric: He has a normal mood and affect.       Assessment:     Physical exam. Generally healthy 37 year old male. Recent weight gain as above    Plan:     -Obtain screening lab work -We challenged  him to try to lose 25-30 pounds over the next year if possible  Kristian Covey MD [facility]

## 2018-05-07 ENCOUNTER — Encounter: Payer: Self-pay | Admitting: Family Medicine

## 2018-05-07 LAB — CBC WITH DIFFERENTIAL/PLATELET
Basophils Absolute: 0.1 10*3/uL (ref 0.0–0.1)
Basophils Relative: 1.1 % (ref 0.0–3.0)
Eosinophils Absolute: 0.1 10*3/uL (ref 0.0–0.7)
Eosinophils Relative: 1.8 % (ref 0.0–5.0)
HCT: 47.3 % (ref 39.0–52.0)
Hemoglobin: 15.8 g/dL (ref 13.0–17.0)
Lymphocytes Relative: 29.5 % (ref 12.0–46.0)
Lymphs Abs: 2 10*3/uL (ref 0.7–4.0)
MCHC: 33.3 g/dL (ref 30.0–36.0)
MCV: 77.3 fl — ABNORMAL LOW (ref 78.0–100.0)
Monocytes Absolute: 0.6 10*3/uL (ref 0.1–1.0)
Monocytes Relative: 8.6 % (ref 3.0–12.0)
Neutro Abs: 4.1 10*3/uL (ref 1.4–7.7)
Neutrophils Relative %: 59 % (ref 43.0–77.0)
Platelets: 253 10*3/uL (ref 150.0–400.0)
RBC: 6.11 Mil/uL — ABNORMAL HIGH (ref 4.22–5.81)
RDW: 14 % (ref 11.5–15.5)
WBC: 6.9 10*3/uL (ref 4.0–10.5)

## 2018-05-07 LAB — BASIC METABOLIC PANEL
BUN: 10 mg/dL (ref 6–23)
CO2: 30 mEq/L (ref 19–32)
Calcium: 9.9 mg/dL (ref 8.4–10.5)
Chloride: 100 mEq/L (ref 96–112)
Creatinine, Ser: 1.34 mg/dL (ref 0.40–1.50)
GFR: 78.01 mL/min (ref >60.00)
Glucose, Bld: 91 mg/dL (ref 70–99)
Potassium: 4.3 mEq/L (ref 3.5–5.1)
Sodium: 138 mEq/L (ref 135–145)

## 2018-05-07 LAB — LIPID PANEL
Cholesterol: 156 mg/dL (ref 0–200)
HDL: 43.1 mg/dL (ref >39.00)
LDL Cholesterol: 98 mg/dL (ref 0–99)
NonHDL: 113.39
Total CHOL/HDL Ratio: 4
Triglycerides: 76 mg/dL (ref 0.0–149.0)
VLDL: 15.2 mg/dL (ref 0.0–40.0)

## 2018-05-07 LAB — HEPATIC FUNCTION PANEL
ALT: 18 U/L (ref 0–53)
AST: 17 U/L (ref 0–37)
Albumin: 4.5 g/dL (ref 3.5–5.2)
Alkaline Phosphatase: 69 U/L (ref 39–117)
Bilirubin, Direct: 0.2 mg/dL (ref 0.0–0.3)
Total Bilirubin: 1.1 mg/dL (ref 0.2–1.2)
Total Protein: 7.8 g/dL (ref 6.0–8.3)

## 2018-05-07 LAB — TSH: TSH: 0.99 u[IU]/mL (ref 0.35–4.50)

## 2018-09-10 DIAGNOSIS — S83242A Other tear of medial meniscus, current injury, left knee, initial encounter: Secondary | ICD-10-CM | POA: Diagnosis not present

## 2018-10-21 HISTORY — PX: ROOT CANAL: SHX2363

## 2018-11-13 ENCOUNTER — Ambulatory Visit: Payer: Self-pay

## 2018-11-13 NOTE — Telephone Encounter (Signed)
Patient called in with c/o "rectal bleeding." He says "yesterday I saw blood in the toilet, but not in the stool. It was a small amount of drops like when you cut your finger and it bleeds drops of blood. On the tissue was a blood clot, it was darker colored. Then I went to the bathroom again yesterday and the same thing. Today I didn't see blood in the toilet, but it was blood on the tissue, no clots. I don't have diarrhea or constipation, I go regularly twice a day." I asked about other symptoms, he denies. According to protocol, see PCP within 3 days, appointment scheduled for Monday, 11/16/18 at 0840 with Dr. Caryl Never, care advice given, patient verbalized understanding.  Reason for Disposition . MILD rectal bleeding (more than just a few drops or streaks)  Answer Assessment - Initial Assessment Questions 1. APPEARANCE of BLOOD: "What color is it?" "Is it passed separately, on the surface of the stool, or mixed in with the stool?"      Yesterday blood in toilet not in stool, today blood on tissue 2. AMOUNT: "How much blood was passed?"      Small amount, blood clot on tissue 3. FREQUENCY: "How many times has blood been passed with the stools?"      Twice yesterday in the toilet, today on the tissue once this morning 4. ONSET: "When was the blood first seen in the stools?" (Days or weeks)      Yesterday 5. DIARRHEA: "Is there also some diarrhea?" If so, ask: "How many diarrhea stools were passed in past 24 hours?"      No 6. CONSTIPATION: "Do you have constipation?" If so, "How bad is it?"     No 7. RECURRENT SYMPTOMS: "Have you had blood in your stools before?" If so, ask: "When was the last time?" and "What happened that time?"      Yes, but not evaluated for it, happened last year around June 8. BLOOD THINNERS: "Do you take any blood thinners?" (e.g., Coumadin/warfarin, Pradaxa/dabigatran, aspirin)     No 9. OTHER SYMPTOMS: "Do you have any other symptoms?"  (e.g., abdominal pain,  vomiting, dizziness, fever)     No 10. PREGNANCY: "Is there any chance you are pregnant?" "When was your last menstrual period?"       N/A  Protocols used: RECTAL BLEEDING-A-AH

## 2018-11-16 ENCOUNTER — Encounter: Payer: Self-pay | Admitting: Gastroenterology

## 2018-11-16 ENCOUNTER — Other Ambulatory Visit: Payer: Self-pay

## 2018-11-16 ENCOUNTER — Encounter: Payer: Self-pay | Admitting: Family Medicine

## 2018-11-16 ENCOUNTER — Ambulatory Visit: Payer: 59 | Admitting: Family Medicine

## 2018-11-16 VITALS — BP 128/82 | HR 81 | Temp 98.3°F | Ht 75.0 in | Wt 277.7 lb

## 2018-11-16 DIAGNOSIS — K921 Melena: Secondary | ICD-10-CM

## 2018-11-16 DIAGNOSIS — J4520 Mild intermittent asthma, uncomplicated: Secondary | ICD-10-CM

## 2018-11-16 LAB — CBC WITH DIFFERENTIAL/PLATELET
Basophils Absolute: 0 10*3/uL (ref 0.0–0.1)
Basophils Relative: 0.6 % (ref 0.0–3.0)
Eosinophils Absolute: 0.1 10*3/uL (ref 0.0–0.7)
Eosinophils Relative: 1.7 % (ref 0.0–5.0)
HCT: 49.2 % (ref 39.0–52.0)
Hemoglobin: 16.7 g/dL (ref 13.0–17.0)
Lymphocytes Relative: 30.2 % (ref 12.0–46.0)
Lymphs Abs: 1.6 10*3/uL (ref 0.7–4.0)
MCHC: 34 g/dL (ref 30.0–36.0)
MCV: 74.8 fl — ABNORMAL LOW (ref 78.0–100.0)
Monocytes Absolute: 0.4 10*3/uL (ref 0.1–1.0)
Monocytes Relative: 8.1 % (ref 3.0–12.0)
Neutro Abs: 3.2 10*3/uL (ref 1.4–7.7)
Neutrophils Relative %: 59.4 % (ref 43.0–77.0)
Platelets: 244 10*3/uL (ref 150.0–400.0)
RBC: 6.57 Mil/uL — ABNORMAL HIGH (ref 4.22–5.81)
RDW: 13.7 % (ref 11.5–15.5)
WBC: 5.3 10*3/uL (ref 4.0–10.5)

## 2018-11-16 MED ORDER — ALBUTEROL SULFATE HFA 108 (90 BASE) MCG/ACT IN AERS: 2.0000 | INHALATION_SPRAY | RESPIRATORY_TRACT | 1 refills | Status: DC | PRN

## 2018-11-16 NOTE — Progress Notes (Signed)
  Subjective:     Patient ID: David Cohen, male   DOB: 09/06/1981, 38 y.o.   MRN: [id number]  HPI Patient is seen with bright red blood per rectum.  This occurred last Thursday.  He states he went to wipe a noticed initially a darker clot followed by some bright blood.  He had a couple of prior similar episodes a few months ago.  No pain with stools.  No recent straining.  No appetite or weight changes.  No history of hemorrhoids or fissures.  Denies any abdominal pain, diarrhea, constipation, or any recent change in stool shapes.  Does not take any aspirin.  Patient states he has uncle and had a grandfather with colon cancer.  No colon cancer in first-degree relatives.  Patient also requesting albuterol refill.  He has mild intermittent asthma which is generally well controlled.  Past Medical History:  Diagnosis Date  . Allergy   . Asthma   . Chicken pox   . Headache(784.0)   . Migraine    Past Surgical History:  Procedure Laterality Date  . KNEE ARTHROSCOPY W/ ACL RECONSTRUCTION  2007  . ROOT CANAL  10/2018  . WISDOM TOOTH EXTRACTION      reports that he has never smoked. He has never used smokeless tobacco. He reports that he does not drink alcohol or use drugs. family history includes Alcohol abuse in his maternal uncle and paternal uncle; Cancer in his maternal aunt and paternal grandfather; Heart disease in his maternal grandmother; Hyperlipidemia in his father; Hypertension in his father; Mental illness in his cousin and paternal grandmother; Stroke in his maternal grandfather and paternal grandfather. Allergies  Allergen Reactions  . Penicillins     As a child "all cillins"     Review of Systems  Constitutional: Negative for appetite change, chills, fever and unexpected weight change.  Respiratory: Negative for shortness of breath.   Gastrointestinal: Positive for anal bleeding. Negative for abdominal pain, blood in stool, constipation, diarrhea, nausea and vomiting.   Neurological: Negative for dizziness.       Objective:   Physical Exam Constitutional:      Appearance: Normal appearance.  Cardiovascular:     Rate and Rhythm: Normal rate and regular rhythm.  Pulmonary:     Effort: Pulmonary effort is normal.     Breath sounds: Normal breath sounds.  Abdominal:     Palpations: Abdomen is soft.     Tenderness: There is no abdominal tenderness.  Genitourinary:    Comments: Exam reveals no visible external hemorrhoids.  No visible anal fissure.  Digital exam no rectal mass palpated.  Brown heme-negative stool.  Anoscopy we cannot appreciate any hemorrhoids Neurological:     Mental Status: He is alert.        Assessment:     #1 intermittent hematochezia.  We explained that benign causes are more likely at his age- but no evidence for obvious hemorrhoid bleed or anal fissures at this time.  He does have positive family history of colon cancer in grandfather and uncle  #2 mild intermittent asthma well-controlled    Plan:     -CBC -Set up GI referral to further evaluate -Follow-up immediately for any heavy rectal bleeding or other concerns -Refill albuterol for as needed use  Kristian Covey MD [facility]

## 2018-11-16 NOTE — Patient Instructions (Signed)
Rectal Bleeding    Rectal bleeding is when blood passes out of the anus. People with rectal bleeding may notice bright red blood in their underwear or in the toilet after having a bowel movement. They may also have dark red or black stools.  Rectal bleeding is usually a sign that something is wrong. Many things can cause rectal bleeding, including:  · Hemorrhoids. These are blood vessels in the anus or rectum that are larger than normal.  · Fistulas. These are abnormal passages in the rectum and anus.  · Anal fissures. This is a tear in the anus.  · Diverticulosis. This is a condition in which pockets or sacs project from the bowel.  · Proctitis and colitis. These are conditions in which the rectum, colon, or anus become inflamed.  · Polyps. These are growths that can be cancerous (malignant) or non-cancerous (benign).  · Part of the rectum sticking out from the anus (rectal prolapse).  · Certain medicines.  · Intestinal infections.  Follow these instructions at home:  Pay attention to any changes in your symptoms. Take these actions to help lessen bleeding and discomfort:  · Eat a diet that is high in fiber. This will keep your stool soft, making it easier to pass stools without straining. Ask your health care provider what foods and drinks are high in fiber.  · Drink enough fluid to keep your urine clear or pale yellow. This also helps to keep your stool soft.  · Try taking a warm bath. This may help soothe any pain in your rectum.  · Keep all follow-up visits as told by your health care provider. This is important.  Get help right away if:  · You have new or increased rectal bleeding.  · You have black or dark red stools.  · You vomit blood or something that looks like coffee grounds.  · You have pain or tenderness in your abdomen.  · You have a fever.  · You feel weak.  · You feel nauseous.  · You faint.  · You have severe pain in your rectum.  · You cannot have a bowel movement.  This information is not  intended to replace advice given to you by your health care provider. Make sure you discuss any questions you have with your health care provider.  Document Released: 03/29/2002 Document Revised: 03/14/2016 Document Reviewed: 12/03/2015  Elsevier Interactive Patient Education © 2019 [employer]

## 2018-11-24 ENCOUNTER — Encounter: Payer: Self-pay | Admitting: Gastroenterology

## 2018-11-24 ENCOUNTER — Ambulatory Visit (INDEPENDENT_AMBULATORY_CARE_PROVIDER_SITE_OTHER): Payer: 59 | Admitting: Gastroenterology

## 2018-11-24 VITALS — BP 130/80 | HR 84 | Ht 77.0 in | Wt 279.0 lb

## 2018-11-24 DIAGNOSIS — K625 Hemorrhage of anus and rectum: Secondary | ICD-10-CM

## 2018-11-24 DIAGNOSIS — Z8371 Family history of colonic polyps: Secondary | ICD-10-CM

## 2018-11-24 NOTE — Patient Instructions (Signed)
If you are age 36 or older, your body mass index should be between 23-30. Your Body mass index is 33.08 kg/m. If this is out of the aforementioned range listed, please consider follow up with your Primary Care Provider.  If you are age 50 or younger, your body mass index should be between 19-25. Your Body mass index is 33.08 kg/m. If this is out of the aformentioned range listed, please consider follow up with your Primary Care Provider.   You have been scheduled for a colonoscopy. Please follow written instructions given to you at your visit today.  Please pick up your prep supplies at the pharmacy within the next 1-3 days. If you use inhalers (even only as needed), please bring them with you on the day of your procedure. Your physician has requested that you go to www.startemmi.com and enter the access code given to you at your visit today. This web site gives a general overview about your procedure. However, you should still follow specific instructions given to you by our office regarding your preparation for the procedure.  Start Fiber supplement once daily.   Thank you for choosing me and [facility].  Dr. Meridee Score

## 2018-11-24 NOTE — Progress Notes (Signed)
GASTROENTEROLOGY OUTPATIENT CLINIC VISIT   Primary Care Provider Kristian Covey, MD [address] [place] [address] [phone]  Referring Provider Kristian Covey, MD [address] [place], [place] [address] [phone]  Patient Profile: David Cohen is a 38 y.o. male with a pmh significant for asthma, migraines.  The patient presents to the [facility] for an evaluation and management of problem(s) noted below:  Problem List 1. Rectal bleeding   2. Family history of colonic polyps     History of Present Illness: This is the patient's first visit to the outpatient of our GI clinic.  The patient describes that over the course of the last 1 to 2 years he has had on/off issues with rectal/anal bleeding.  Most the time what he had noted was some mild blood on the toilet paper.  However in January he had approximately 3 days worth of blood that initially looked like blood clots and then would have blood on the toilet paper as well as in the toilet bowl.  Normally the patient uses the restroom 2-3 times per day to have a bowel movement.  He describes not having significant amounts or concerns of straining.  All of these episodes in the past have always been painless.  He uses NSAIDs infrequently.  Does describe that if he enjoys a very spicy meal or takes in foods that may be high in lactose that he will have increased bowel movements around that time.  But usually again is pretty regular.  He uses no stool softeners or fiber supplements.  Within the last month he started using a tea for detox but that is not something that he had used previously.  He had been told in the past that he may have hemorrhoids but has never had an endoscopic evaluation.  He otherwise has no significant GI complaints as noted below.  GI Review of Systems Positive as above Negative for pyrosis, dysphagia, odynophagia, jaundice, nausea, vomiting, abdominal pain,  melena, incontinence of feces  Review of Systems General: Denies fevers/chills/weight loss (has actually gained 20 pounds within the last year) HEENT: Denies oral lesions Cardiovascular: Denies chest pain Pulmonary: Denies shortness of breath Gastroenterological: See HPI Genitourinary: Denies darkened urine Hematological: Denies easy bruising/bleeding Endocrine: Denies temperature intolerance Dermatological: Denies jaundice Psychological: Mood is stable Musculoskeletal: Denies new arthralgias   Medications Current Outpatient Medications  Medication Sig Dispense Refill  . albuterol (PROVENTIL HFA;VENTOLIN HFA) 108 (90 Base) MCG/ACT inhaler Inhale 2 puffs into the lungs every 4 (four) hours as needed. 1 Inhaler 1   No current facility-administered medications for this visit.    Facility-Administered Medications Ordered in Other Visits  Medication Dose Route Frequency Provider Last Rate Last Dose  . ipratropium-albuterol (DUONEB) 0.5-2.5 (3) MG/3ML nebulizer solution 3 mL  3 mL Nebulization Once Shirline Frees, NP        Allergies Allergies  Allergen Reactions  . Penicillins Anaphylaxis    As a child "all cillins"    Histories Past Medical History:  Diagnosis Date  . Allergy   . Asthma   . Chicken pox   . Headache(784.0)   . Migraine    Past Surgical History:  Procedure Laterality Date  . KNEE ARTHROSCOPY W/ ACL RECONSTRUCTION  2007, 2012,2018   x 3  . ROOT CANAL  10/2018  . WISDOM TOOTH EXTRACTION     Social History   Socioeconomic History  . Marital status: Married    Spouse name: Not on file  .  Number of children: 3  . Years of education: Not on file  . Highest education level: Not on file  Occupational History  . Occupation: [employer]  Social Needs  . Financial resource strain: Not on file  . Food insecurity:    Worry: Not on file    Inability: Not on file  . Transportation needs:    Medical: Not on file    Non-medical: Not on file    Tobacco Use  . Smoking status: Never Smoker  . Smokeless tobacco: Never Used  Substance and Sexual Activity  . Alcohol use: No  . Drug use: No  . Sexual activity: Not on file  Lifestyle  . Physical activity:    Days per week: Not on file    Minutes per session: Not on file  . Stress: Not on file  Relationships  . Social connections:    Talks on phone: Not on file    Gets together: Not on file    Attends religious service: Not on file    Active member of club or organization: Not on file    Attends meetings of clubs or organizations: Not on file    Relationship status: Not on file  . Intimate partner violence:    Fear of current or ex partner: Not on file    Emotionally abused: Not on file    Physically abused: Not on file    Forced sexual activity: Not on file  Other Topics Concern  . Not on file  Social History Narrative  . Not on file   Family History  Problem Relation Age of Onset  . Hyperlipidemia Father   . Hypertension Father   . Colon polyps Father   . Cancer Maternal Aunt        gall bladder  . Alcohol abuse Maternal Uncle   . Diabetes Maternal Uncle   . Alcohol abuse Paternal Uncle   . Heart disease Maternal Grandmother   . Stroke Maternal Grandfather   . Mental illness Paternal Grandmother   . Cancer Paternal Grandfather        colon  . Stroke Paternal Grandfather   . Prostate cancer Paternal Grandfather   . Mental illness Cousin   . Colon cancer Neg Hx   . Esophageal cancer Neg Hx   . Inflammatory bowel disease Neg Hx   . Liver disease Neg Hx   . Pancreatic cancer Neg Hx   . Stomach cancer Neg Hx   . Rectal cancer Neg Hx    I have reviewed his medical, social, and family history in detail and updated the electronic medical record as necessary.    PHYSICAL EXAMINATION  BP 130/80   Pulse 84   Ht 6\' 5"  (1.956 m)   Wt 279 lb (126.6 kg)   BMI 33.08 kg/m  Wt Readings from Last 3 Encounters:  11/24/18 279 lb (126.6 kg)  11/16/18 277 lb 11.2 oz  (126 kg)  05/06/18 275 lb 12.8 oz (125.1 kg)  GEN: NAD, appears stated age, doesn't appear chronically ill PSYCH: Cooperative, without pressured speech EYE: Conjunctivae pink, sclerae anicteric ENT: MMM, without oral ulcers, no erythema or exudates noted NECK: Supple, enlarged neck girth CV: RR without R/Gs  RESP: CTAB posteriorly, without wheezing GI: NABS, soft, NT/ND, without rebound or guarding, no HSM appreciated GU: Perianal exam suggested internal hemorrhoids, full DRE was deferred since patient is likely moving forward with a colonoscopy MSK/EXT: No lower extremity edema SKIN: No jaundice NEURO:  Alert &  Oriented x 3, no focal deficits   REVIEW OF DATA  I reviewed the following data at the time of this encounter:  GI Procedures and Studies  No relevant studies to review  Laboratory Studies  Reviewed in epic  Imaging Studies  No relevant studies to review   ASSESSMENT  Mr. Thurmon FairVereen is a 38 y.o. male with a pmh significant for asthma, migraines.  The patient is seen today for evaluation and management of:  1. Rectal bleeding   2. Family history of colonic polyps    The patient is hemodynamically stable.  The history is most significant for likely hemorrhoidal disease however based on the episodes most recently and the findings of blood clots that is a little bit atypical for just hemorrhoids.  Diverticular disease is certainly a possibility as well though thankfully his blood counts have not had any issues.  Not had any recurrence since January.  Will for us to proceed with a diagnostic colonoscopy to evaluate ensure he does not also have any anterior cages or masses or lesions but my pretest probability is low for him to have 1 of those issues.  The risks and benefits of endoscopic evaluation were discussed with the patient; these include but are not limited to the risk of perforation, infection, bleeding, missed lesions, lack of diagnosis, severe illness requiring  hospitalization, as well as anesthesia and sedation related illnesses.  The patient is agreeable to proceed.  All patient questions were answered, to the best of my ability, and the patient agrees to the aforementioned plan of action with follow-up as indicated.   PLAN  Proceed with scheduling a diagnostic colonoscopy Initiate fiber supplementation once daily We will consider stool softeners in the future as well   Orders Placed This Encounter  Procedures  . Ambulatory referral to Gastroenterology    New Prescriptions   No medications on file   Modified Medications   No medications on file    Planned Follow Up: No follow-ups on file.   Corliss ParishGabriel Mansouraty, MD [facility] Office # [phone]

## 2018-11-26 ENCOUNTER — Encounter: Payer: Self-pay | Admitting: Gastroenterology

## 2018-11-26 DIAGNOSIS — Z8371 Family history of colonic polyps: Secondary | ICD-10-CM | POA: Insufficient documentation

## 2018-11-26 DIAGNOSIS — K625 Hemorrhage of anus and rectum: Secondary | ICD-10-CM | POA: Insufficient documentation

## 2018-11-30 ENCOUNTER — Telehealth: Payer: Self-pay | Admitting: Gastroenterology

## 2018-11-30 ENCOUNTER — Other Ambulatory Visit: Payer: Self-pay

## 2018-11-30 MED ORDER — PEG 3350-KCL-NA BICARB-NACL 420 G PO SOLR: 4000.0000 mL | Freq: Once | ORAL | 0 refills | Status: AC

## 2018-11-30 NOTE — Telephone Encounter (Signed)
Pt called in and stated prep was not called in to the pharm.

## 2018-11-30 NOTE — Telephone Encounter (Signed)
Golytely prep sent to pts pharmacy. Pt informed.

## 2018-12-04 ENCOUNTER — Ambulatory Visit (AMBULATORY_SURGERY_CENTER): Payer: 59 | Admitting: Gastroenterology

## 2018-12-04 ENCOUNTER — Encounter: Payer: Self-pay | Admitting: Gastroenterology

## 2018-12-04 VITALS — BP 134/92 | HR 73 | Temp 97.7°F | Resp 19 | Ht 77.0 in | Wt 279.0 lb

## 2018-12-04 DIAGNOSIS — K625 Hemorrhage of anus and rectum: Secondary | ICD-10-CM

## 2018-12-04 DIAGNOSIS — K6289 Other specified diseases of anus and rectum: Secondary | ICD-10-CM

## 2018-12-04 DIAGNOSIS — K599 Functional intestinal disorder, unspecified: Secondary | ICD-10-CM | POA: Diagnosis not present

## 2018-12-04 MED ORDER — SODIUM CHLORIDE 0.9 % IV SOLN: 500.0000 mL | Freq: Once | INTRAVENOUS | Status: DC

## 2018-12-04 NOTE — Progress Notes (Signed)
Report given to PACU, vss 

## 2018-12-04 NOTE — Op Note (Signed)
[facility] Patient Name: David Cohen Procedure Date: 12/04/2018 11:14 AM MRN: [id number] Endoscopist: Justice Britain , MD Age: 38 Referring MD:  Date of Birth: 1981/08/01 Gender: Male Account #: [id number] Procedure:                Colonoscopy Indications:              Hematochezia Medicines:                Monitored Anesthesia Care Procedure:                Pre-Anesthesia Assessment:                           - Prior to the procedure, a History and Physical                            was performed, and patient medications and                            allergies were reviewed. The patient's tolerance of                            previous anesthesia was also reviewed. The risks                            and benefits of the procedure and the sedation                            options and risks were discussed with the patient.                            All questions were answered, and informed consent                            was obtained. Prior Anticoagulants: The patient has                            taken no previous anticoagulant or antiplatelet                            agents. ASA Grade Assessment: II - A patient with                            mild systemic disease. After reviewing the risks                            and benefits, the patient was deemed in                            satisfactory condition to undergo the procedure.                           After obtaining informed consent, the colonoscope  was passed under direct vision. Throughout the                            procedure, the patient's blood pressure, pulse, and                            oxygen saturations were monitored continuously. The                            Colonoscope was introduced through the anus and                            advanced to the 5 cm into the ileum. The                            colonoscopy was performed without difficulty. The                         patient tolerated the procedure. The quality of the                            bowel preparation was excellent. The terminal                            ileum, ileocecal valve, appendiceal orifice, and                            rectum were photographed. Scope In: 11:30:24 AM Scope Out: 11:47:15 AM Scope Withdrawal Time: 0 hours 13 minutes 8 seconds  Total Procedure Duration: 0 hours 16 minutes 51 seconds  Findings:                 Hemorrhoids were found on perianal exam.                           The terminal ileum and ileocecal valve appeared                            normal.                           Normal mucosa was found in the entire colon.                           Normal mucosa was found in the rectum. Biopsies                            were taken with a cold forceps for histology. to                            rule out proctitis                           Non-bleeding non-thrombosed internal hemorrhoids  were found during retroflexion, during perianal                            exam and during digital exam. The hemorrhoids were                            Grade II (internal hemorrhoids that prolapse but                            reduce spontaneously). Complications:            No immediate complications. Estimated Blood Loss:     Estimated blood loss was minimal. Impression:               - Hemorrhoids found on perianal exam.                           - The examined portion of the ileum was normal.                           - Normal mucosa in the entire examined colon.                           - Normal mucosa in the rectum. Biopsied.                           - Non-bleeding non-thrombosed internal hemorrhoids. Recommendation:           - The patient will be observed post-procedure,                            until all discharge criteria are met.                           - Discharge patient to home.                           -  Patient has a contact number available for                            emergencies. The signs and symptoms of potential                            delayed complications were discussed with the                            patient. Return to normal activities tomorrow.                            Written discharge instructions were provided to the                            patient.                           - High fiber diet.                           -  Use fiber, for example Citrucel, Fibercon, Konsyl                            or Metamucil.                           - Preparation H cream: Apply externally TID.                           - Take Sitz baths BID.                           - Start Miralax once daily                           - Start Colace 100 mg BID.                           - Continue present medications.                           - Await pathology results.                           - Repeat colonoscopy in 10 years for screening                            purposes.                           - Based on patient's bowel movements will consider                            role of hemorrhoidal banding.                           - The findings and recommendations were discussed                            with the patient.                           - The findings and recommendations were discussed                            with the patient's family. Justice Britain, MD 12/04/2018 12:04:32 PM

## 2018-12-04 NOTE — Patient Instructions (Signed)
Please see Dr. Elesa Hacker recommendations.   YOU HAD AN ENDOSCOPIC PROCEDURE TODAY AT THE [facility]:   Refer to the procedure report that was given to you for any specific questions about what was found during the examination.  If the procedure report does not answer your questions, please call your gastroenterologist to clarify.  If you requested that your care partner not be given the details of your procedure findings, then the procedure report has been included in a sealed envelope for you to review at your convenience later.  YOU SHOULD EXPECT: Some feelings of bloating in the abdomen. Passage of more gas than usual.  Walking can help get rid of the air that was put into your GI tract during the procedure and reduce the bloating. If you had a lower endoscopy (such as a colonoscopy or flexible sigmoidoscopy) you may notice spotting of blood in your stool or on the toilet paper. If you underwent a bowel prep for your procedure, you may not have a normal bowel movement for a few days.  Please Note:  You might notice some irritation and congestion in your nose or some drainage.  This is from the oxygen used during your procedure.  There is no need for concern and it should clear up in a day or so.  SYMPTOMS TO REPORT IMMEDIATELY:   Following lower endoscopy (colonoscopy or flexible sigmoidoscopy):  Excessive amounts of blood in the stool  Significant tenderness or worsening of abdominal pains  Swelling of the abdomen that is new, acute  Fever of 100F or higher  For urgent or emergent issues, a gastroenterologist can be reached at any hour by calling [phone].   DIET:  We do recommend a small meal at first, but then you may proceed to your regular diet.  Drink plenty of fluids but you should avoid alcoholic beverages for 24 hours.  ACTIVITY:  You should plan to take it easy for the rest of today and you should NOT DRIVE or use heavy machinery until tomorrow (because of  the sedation medicines used during the test).    FOLLOW UP: Our staff will call the number listed on your records the next business day following your procedure to check on you and address any questions or concerns that you may have regarding the information given to you following your procedure. If we do not reach you, we will leave a message.  However, if you are feeling well and you are not experiencing any problems, there is no need to return our call.  We will assume that you have returned to your regular daily activities without incident.  If any biopsies were taken you will be contacted by phone or by letter within the next 1-3 weeks.  Please call us at [phone] if you have not heard about the biopsies in 3 weeks.    SIGNATURES/CONFIDENTIALITY: You and/or your care partner have signed paperwork which will be entered into your electronic medical record.  These signatures attest to the fact that that the information above on your After Visit Summary has been reviewed and is understood.  Full responsibility of the confidentiality of this discharge information lies with you and/or your care-partner.

## 2018-12-04 NOTE — Progress Notes (Signed)
Pt's states no medical or surgical changes since previsit or office visit. 

## 2018-12-07 ENCOUNTER — Telehealth: Payer: Self-pay

## 2018-12-07 NOTE — Telephone Encounter (Signed)
Follow up call x 2 , unable to leave a voicemail.

## 2018-12-07 NOTE — Telephone Encounter (Signed)
  Follow up Call-  Call back number 12/04/2018  Post procedure Call Back phone  # [phone]  Permission to leave phone message Yes  Some recent data might be hidden     Busy signal

## 2018-12-09 ENCOUNTER — Encounter: Payer: Self-pay | Admitting: Gastroenterology

## 2019-01-14 ENCOUNTER — Ambulatory Visit: Payer: 59 | Admitting: Gastroenterology

## 2019-12-25 ENCOUNTER — Ambulatory Visit: Payer: 59 | Attending: Internal Medicine

## 2019-12-25 DIAGNOSIS — Z23 Encounter for immunization: Secondary | ICD-10-CM

## 2019-12-25 NOTE — Progress Notes (Signed)
   Covid-19 Vaccination Clinic  Name:  David Cohen    MRN: [id number] DOB: 08/07/1981  12/25/2019  Mr. David Cohen was observed post Covid-19 immunization for 15 minutes without incident. He was provided with Vaccine Information Sheet and instruction to access the V-Safe system.   Mr. David Cohen was instructed to call 911 with any severe reactions post vaccine: Marland Kitchen Difficulty breathing  . Swelling of face and throat  . A fast heartbeat  . A bad rash all over body  . Dizziness and weakness   Immunizations Administered    Name Date Dose VIS Date Route   Pfizer COVID-19 Vaccine 12/25/2019  9:41 AM 0.3 mL 10/01/2019 Intramuscular   Manufacturer: [employer], [employer]   Lot: [id number]   NDC: [address]-8

## 2020-01-15 ENCOUNTER — Ambulatory Visit: Payer: 59 | Attending: Internal Medicine

## 2020-01-15 DIAGNOSIS — Z23 Encounter for immunization: Secondary | ICD-10-CM

## 2020-01-15 NOTE — Progress Notes (Signed)
   Covid-19 Vaccination Clinic  Name:  David Cohen    MRN: [id number] DOB: 11-14-1980  01/15/2020  David Cohen was observed post Covid-19 immunization for 15 minutes without incident. He was provided with Vaccine Information Sheet and instruction to access the V-Safe system.   David Cohen was instructed to call 911 with any severe reactions post vaccine: Marland Kitchen Difficulty breathing  . Swelling of face and throat  . A fast heartbeat  . A bad rash all over body  . Dizziness and weakness   Immunizations Administered    Name Date Dose VIS Date Route   Pfizer COVID-19 Vaccine 01/15/2020  9:59 AM 0.3 mL 10/01/2019 Intramuscular   Manufacturer: [employer], [employer]   Lot: [id number]   NDC: [address]-6

## 2020-05-08 ENCOUNTER — Other Ambulatory Visit: Payer: Self-pay

## 2020-05-08 ENCOUNTER — Encounter: Payer: Self-pay | Admitting: Family Medicine

## 2020-05-08 ENCOUNTER — Ambulatory Visit (INDEPENDENT_AMBULATORY_CARE_PROVIDER_SITE_OTHER): Payer: 59 | Admitting: Family Medicine

## 2020-05-08 VITALS — BP 112/84 | HR 79 | Temp 98.1°F | Ht 75.25 in | Wt 285.5 lb

## 2020-05-08 DIAGNOSIS — Z0000 Encounter for general adult medical examination without abnormal findings: Secondary | ICD-10-CM | POA: Diagnosis not present

## 2020-05-08 DIAGNOSIS — Z23 Encounter for immunization: Secondary | ICD-10-CM
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
## 2020-05-08 NOTE — Patient Instructions (Signed)
Preventive Care 19-37 Years Old, Male Preventive care refers to lifestyle choices and visits with your health care provider that can promote health and wellness. This includes:  A yearly physical exam. This is also called an annual well check.  Regular dental and eye exams.  Immunizations.  Screening for certain conditions.  Healthy lifestyle choices, such as eating a healthy diet, getting regular exercise, not using drugs or products that contain nicotine and tobacco, and limiting alcohol use. What can I expect for my preventive care visit? Physical exam Your health care provider will check:  Height and weight. These may be used to calculate body mass index (BMI), which is a measurement that tells if you are at a healthy weight.  Heart rate and blood pressure.  Your skin for abnormal spots. Counseling Your health care provider may ask you questions about:  Alcohol, tobacco, and drug use.  Emotional well-being.  Home and relationship well-being.  Sexual activity.  Eating habits.  Work and work Statistician. What immunizations do I need?  Influenza (flu) vaccine  This is recommended every year. Tetanus, diphtheria, and pertussis (Tdap) vaccine  You may need a Td booster every 10 years. Varicella (chickenpox) vaccine  You may need this vaccine if you have not already been vaccinated. Human papillomavirus (HPV) vaccine  If recommended by your health care provider, you may need three doses over 6 months. Measles, mumps, and rubella (MMR) vaccine  You may need at least one dose of MMR. You may also need a second dose. Meningococcal conjugate (MenACWY) vaccine  One dose is recommended if you are 45-76 years old and a Market researcher living in a residence hall, or if you have one of several medical conditions. You may also need additional booster doses. Pneumococcal conjugate (PCV13) vaccine  You may need this if you have certain conditions and were not  previously vaccinated. Pneumococcal polysaccharide (PPSV23) vaccine  You may need one or two doses if you smoke cigarettes or if you have certain conditions. Hepatitis A vaccine  You may need this if you have certain conditions or if you travel or work in places where you may be exposed to hepatitis A. Hepatitis B vaccine  You may need this if you have certain conditions or if you travel or work in places where you may be exposed to hepatitis B. Haemophilus influenzae type b (Hib) vaccine  You may need this if you have certain risk factors. You may receive vaccines as individual doses or as more than one vaccine together in one shot (combination vaccines). Talk with your health care provider about the risks and benefits of combination vaccines. What tests do I need? Blood tests  Lipid and cholesterol levels. These may be checked every 5 years starting at age 17.  Hepatitis C test.  Hepatitis B test. Screening   Diabetes screening. This is done by checking your blood sugar (glucose) after you have not eaten for a while (fasting).  Sexually transmitted disease (STD) testing. Talk with your health care provider about your test results, treatment options, and if necessary, the need for more tests. Follow these instructions at home: Eating and drinking   Eat a diet that includes fresh fruits and vegetables, whole grains, lean protein, and low-fat dairy products.  Take vitamin and mineral supplements as recommended by your health care provider.  Do not drink alcohol if your health care provider tells you not to drink.  If you drink alcohol: ? Limit how much you have to 0-2  drinks a day. ? Be aware of how much alcohol is in your drink. In the [place] one drink equals one 12 oz bottle of beer (355 mL), one 5 oz glass of wine (148 mL), or one 1 oz glass of hard liquor (44 mL). Lifestyle  Take daily care of your teeth and gums.  Stay active. Exercise for at least 30 minutes on 5 or  more days each week.  Do not use any products that contain nicotine or tobacco, such as cigarettes, e-cigarettes, and chewing tobacco. If you need help quitting, ask your health care provider.  If you are sexually active, practice safe sex. Use a condom or other form of protection to prevent STIs (sexually transmitted infections). What's next?  Go to your health care provider once a year for a well check visit.  Ask your health care provider how often you should have your eyes and teeth checked.  Stay up to date on all vaccines. This information is not intended to replace advice given to you by your health care provider. Make sure you discuss any questions you have with your health care provider. Document Revised: 10/01/2018 Document Reviewed: 10/01/2018 Elsevier Patient Education  2020 [employer].

## 2020-05-09 LAB — HEPATIC FUNCTION PANEL
AG Ratio: 1.3 (calc) (ref 1.0–2.5)
ALT: 18 U/L (ref 9–46)
AST: 15 U/L (ref 10–40)
Albumin: 4.3 g/dL (ref 3.6–5.1)
Alkaline phosphatase (APISO): 64 U/L (ref 36–130)
Bilirubin, Direct: 0.2 mg/dL (ref 0.0–0.2)
Globulin: 3.2 g/dL (calc) (ref 1.9–3.7)
Indirect Bilirubin: 0.7 mg/dL (calc) (ref 0.2–1.2)
Total Bilirubin: 0.9 mg/dL (ref 0.2–1.2)
Total Protein: 7.5 g/dL (ref 6.1–8.1)

## 2020-05-09 LAB — LIPID PANEL
Cholesterol: 170 mg/dL (ref <200)
HDL: 42 mg/dL (ref >=40)
LDL Cholesterol (Calc): 107 mg/dL (calc) — ABNORMAL HIGH
Non-HDL Cholesterol (Calc): 128 mg/dL (calc) (ref <130)
Total CHOL/HDL Ratio: 4 (calc) (ref <5.0)
Triglycerides: 115 mg/dL (ref <150)

## 2020-05-09 LAB — CBC WITH DIFFERENTIAL/PLATELET
Absolute Monocytes: 407 cells/uL (ref 200–950)
Basophils Absolute: 39 cells/uL (ref 0–200)
Basophils Relative: 0.8 %
Eosinophils Absolute: 88 cells/uL (ref 15–500)
Eosinophils Relative: 1.8 %
HCT: 50.5 % — ABNORMAL HIGH (ref 38.5–50.0)
Hemoglobin: 16.5 g/dL (ref 13.2–17.1)
Lymphs Abs: 1691 cells/uL (ref 850–3900)
MCH: 25.1 pg — ABNORMAL LOW (ref 27.0–33.0)
MCHC: 32.7 g/dL (ref 32.0–36.0)
MCV: 76.7 fL — ABNORMAL LOW (ref 80.0–100.0)
MPV: 10.4 fL (ref 7.5–12.5)
Monocytes Relative: 8.3 %
Neutro Abs: 2675 cells/uL (ref 1500–7800)
Neutrophils Relative %: 54.6 %
Platelets: 253 10*3/uL (ref 140–400)
RBC: 6.58 10*6/uL — ABNORMAL HIGH (ref 4.20–5.80)
RDW: 15.6 % — ABNORMAL HIGH (ref 11.0–15.0)
Total Lymphocyte: 34.5 %
WBC: 4.9 10*3/uL (ref 3.8–10.8)

## 2020-05-09 LAB — BASIC METABOLIC PANEL
BUN: 12 mg/dL (ref 7–25)
CO2: 25 mmol/L (ref 20–32)
Calcium: 9.9 mg/dL (ref 8.6–10.3)
Chloride: 104 mmol/L (ref 98–110)
Creat: 1.27 mg/dL (ref 0.60–1.35)
Glucose, Bld: 83 mg/dL (ref 65–99)
Potassium: 4.4 mmol/L (ref 3.5–5.3)
Sodium: 139 mmol/L (ref 135–146)

## 2020-05-09 LAB — TSH: TSH: 1.15 mIU/L (ref 0.40–4.50)

## 2020-05-09 LAB — HEPATITIS C ANTIBODY
Hepatitis C Ab: NONREACTIVE
SIGNAL TO CUT-OFF: 0.02 (ref <1.00)

## 2020-05-30 ENCOUNTER — Telehealth: Payer: Self-pay | Admitting: *Deleted

## 2020-05-30 NOTE — Telephone Encounter (Signed)
On: 05/29/2020 at 9:11:00 AM patient called nurse triage line.Caller states he believes he has been exposed with COVID-19. States his child was at a camp and someone was exposed. He has been vaccinated and no symptoms Works for [employer]. Family being tested. Tried calling patient to follow-up. Left message to return call to the office.

## 2020-05-30 NOTE — Telephone Encounter (Signed)
Noted  

## 2020-06-12 ENCOUNTER — Telehealth: Payer: Self-pay | Admitting: Family Medicine

## 2020-06-12 DIAGNOSIS — M25539 Pain in unspecified wrist: Secondary | ICD-10-CM

## 2020-06-12 DIAGNOSIS — M79673 Pain in unspecified foot: Secondary | ICD-10-CM

## 2020-06-12 NOTE — Telephone Encounter (Signed)
OK to refer to [facility] (Dr Orlan Leavens or Amanda Pea)-  unless he has another preference.

## 2020-06-12 NOTE — Addendum Note (Signed)
Addended by: Waymon Amato R on: 06/12/2020 03:10 PM   Modules accepted: Orders

## 2020-06-12 NOTE — Telephone Encounter (Signed)
Patient notified of update  and verbalized understanding. Pt [facility]

## 2020-06-12 NOTE — Addendum Note (Signed)
Addended by: Waymon Amato R on: 06/12/2020 03:03 PM   Modules accepted: Orders

## 2020-06-12 NOTE — Telephone Encounter (Signed)
Please advise  Pt has been seen for this issue in July and was advised per pt to call back for referral

## 2020-06-12 NOTE — Telephone Encounter (Signed)
Pt is calling to see if he can get a referral to a hand specialist for his wrist. Also he thinks his lt foot metatarsal is broken..  Please advise

## 2020-10-03 ENCOUNTER — Encounter (HOSPITAL_COMMUNITY): Payer: Self-pay

## 2020-10-03 ENCOUNTER — Emergency Department (HOSPITAL_COMMUNITY)
Admission: EM | Admit: 2020-10-03 | Discharge: 2020-10-03 | Disposition: A | Discharge status: Home / Self Care | Payer: 59 | Attending: Emergency Medicine | Admitting: Emergency Medicine

## 2020-10-03 ENCOUNTER — Emergency Department (HOSPITAL_COMMUNITY): Payer: 59

## 2020-10-03 DIAGNOSIS — Z5321 Procedure and treatment not carried out due to patient leaving prior to being seen by health care provider: Secondary | ICD-10-CM | POA: Insufficient documentation

## 2020-10-03 DIAGNOSIS — R079 Chest pain, unspecified: Secondary | ICD-10-CM | POA: Diagnosis not present

## 2020-10-03 LAB — BASIC METABOLIC PANEL
Anion gap: 7 (ref 5–15)
BUN: 16 mg/dL (ref 6–20)
CO2: 27 mmol/L (ref 22–32)
Calcium: 9.4 mg/dL (ref 8.9–10.3)
Chloride: 103 mmol/L (ref 98–111)
Creatinine, Ser: 1.26 mg/dL — ABNORMAL HIGH (ref 0.61–1.24)
GFR, Estimated: >60 mL/min (ref >60)
Glucose, Bld: 100 mg/dL — ABNORMAL HIGH (ref 70–99)
Potassium: 4.1 mmol/L (ref 3.5–5.1)
Sodium: 137 mmol/L (ref 135–145)

## 2020-10-03 LAB — TROPONIN I (HIGH SENSITIVITY): Troponin I (High Sensitivity): 10 ng/L (ref <18)

## 2020-10-03 LAB — CBC
HCT: 48.3 % (ref 39.0–52.0)
Hemoglobin: 16.5 g/dL (ref 13.0–17.0)
MCH: 25.6 pg — ABNORMAL LOW (ref 26.0–34.0)
MCHC: 34.2 g/dL (ref 30.0–36.0)
MCV: 75 fL — ABNORMAL LOW (ref 80.0–100.0)
Platelets: 273 10*3/uL (ref 150–400)
RBC: 6.44 MIL/uL — ABNORMAL HIGH (ref 4.22–5.81)
RDW: 13.8 % (ref 11.5–15.5)
WBC: 7 10*3/uL (ref 4.0–10.5)
nRBC: 0 % (ref 0.0–0.2)

## 2020-10-03 NOTE — ED Triage Notes (Signed)
Pt presents with c/o chest pain that started yesterday. Pt does report that his breathing feels "laboring" when he takes a deep breath. NAD.

## 2020-10-06 ENCOUNTER — Other Ambulatory Visit: Payer: Self-pay

## 2020-10-06 ENCOUNTER — Ambulatory Visit: Payer: 59 | Admitting: Family Medicine

## 2020-10-06 ENCOUNTER — Encounter: Payer: Self-pay | Admitting: Family Medicine

## 2020-10-06 VITALS — BP 116/82 | HR 60 | Temp 97.9°F | Ht 75.25 in | Wt 283.2 lb

## 2020-10-06 DIAGNOSIS — R0789 Other chest pain: Secondary | ICD-10-CM | POA: Diagnosis not present

## 2020-10-06 NOTE — Progress Notes (Signed)
Established Patient Office Visit  Subjective:  Patient ID: David Cohen, male    DOB: [birth year]  Age: 39 y.o. MRN: [id number]  CC:  Chief Complaint  Patient presents with  . Follow-up    HPI David Cohen presents for ER follow-up.  He states that this past Monday when he woke up around 6 AM he noticed some left chest discomfort.  This was fairly localized more lateral chest wall area.  He had a little bit of discomfort with direct pressure.  No known injury.  He did do some playing at a children's trampoline type setting over the weekend but denied any specific injury then.  Around 11:30 AM Monday he went for 3 mile run at a good pace and felt fine.  Tuesday morning he woke up and had blood pressure reading of 154/110 and subsequent rating 150/100.  He states his heart rate was up around 94 and he continued to have some mild left-sided chest symptoms.  Went to the ER for further evaluation  He states he stayed there over 7 hours and did have blood work and chest x-ray as well as EKG.  He had troponin, CBC, basic metabolic panel with no acute abnormalities.  Chest x-ray no acute findings.  EKG result has not been released.  He went home at that point without being seen.  Non-smoker.  No history of diabetes.  Maternal grandmother with coronary disease otherwise no premature CAD history.  He denies any recent fever, chills, night sweats, cough, hemoptysis  Past Medical History:  Diagnosis Date  . Allergy   . Asthma   . Chicken pox   . Headache(784.0)   . Migraine     Past Surgical History:  Procedure Laterality Date  . KNEE ARTHROSCOPY W/ ACL RECONSTRUCTION  2007, 2012,2018   x 3  . ROOT CANAL  10/2018  . WISDOM TOOTH EXTRACTION      Family History  Problem Relation Age of Onset  . Hyperlipidemia Father   . Hypertension Father   . Colon polyps Father   . Cancer Maternal Aunt        gall bladder  . Alcohol abuse Maternal Uncle   . Diabetes Maternal Uncle   .  Alcohol abuse Paternal Uncle   . Heart disease Maternal Grandmother   . Stroke Maternal Grandfather   . Mental illness Paternal Grandmother   . Cancer Paternal Grandfather        colon  . Stroke Paternal Grandfather   . Prostate cancer Paternal Grandfather   . Mental illness Cousin   . Colon cancer Neg Hx   . Esophageal cancer Neg Hx   . Inflammatory bowel disease Neg Hx   . Liver disease Neg Hx   . Pancreatic cancer Neg Hx   . Stomach cancer Neg Hx   . Rectal cancer Neg Hx     Social History   Socioeconomic History  . Marital status: Married    Spouse name: Not on file  . Number of children: 3  . Years of education: Not on file  . Highest education level: Not on file  Occupational History  . 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 Smoking status: Never Smoker  . Smokeless tobacco: Never Used  Vaping Use  . Vaping Use: Never used  Substance and Sexual Activity  . Alcohol use: No  . Drug use: No  . Sexual activity: Not on file  Other Topics Concern  . Not on file  Social History Narrative  . Not on file   Social Determinants of Health   Financial Resource Strain: Not on file  Food Insecurity: Not on file  Transportation Needs: Not on file  Physical Activity: Not on file  Stress: Not on file  Social Connections: Not on file  Intimate Partner Violence: Not on file    Outpatient Medications Prior to Visit  Medication Sig Dispense Refill  . albuterol (PROVENTIL HFA;VENTOLIN HFA) 108 (90 Base) MCG/ACT inhaler Inhale 2 puffs into the lungs every 4 (four) hours as needed. 1 Inhaler 1   Facility-Administered Medications Prior to Visit  Medication Dose Route Frequency Provider Last Rate Last Admin  . ipratropium-albuterol (DUONEB) 0.5-2.5 (3) MG/3ML nebulizer solution 3 mL  3 mL Nebulization Once Shirline Frees, NP        Allergies  Allergen Reactions  . Penicillins Anaphylaxis    As a child "all cillins"    ROS Review of Systems  Constitutional:  Negative for appetite change, chills, fever and unexpected weight change.  Respiratory: Negative for cough and shortness of breath.   Cardiovascular: Negative for leg swelling.  Genitourinary: Negative for dysuria.  Neurological: Negative for dizziness.      Objective:    Physical Exam Vitals reviewed.  Constitutional:      Appearance: Normal appearance.  Cardiovascular:     Rate and Rhythm: Normal rate and regular rhythm.     Heart sounds: No murmur heard.   Pulmonary:     Effort: Pulmonary effort is normal.     Breath sounds: Normal breath sounds. No wheezing or rales.  Musculoskeletal:     Comments: Left chest wall slightly tender to palpation  Skin:    Findings: No rash.  Neurological:     Mental Status: He is alert.     BP 116/82 (BP Location: Left Arm, Patient Position: Sitting, Cuff Size: Large)   Pulse 60   Temp 97.9 F (36.6 C) (Oral)   Ht 6' 3.25" (1.911 m)   Wt 283 lb 3.2 oz (128.5 kg)   SpO2 97%   BMI 35.16 kg/m  Wt Readings from Last 3 Encounters:  10/06/20 283 lb 3.2 oz (128.5 kg)  05/08/20 285 lb 8 oz (129.5 kg)  12/04/18 279 lb (126.6 kg)     Health Maintenance Due  Topic Date Due  . HIV Screening  Never done  . INFLUENZA VACCINE  05/21/2020    There are no preventive care reminders to display for this patient.  Lab Results  Component Value Date   TSH 1.15 05/08/2020   Lab Results  Component Value Date   WBC 7.0 10/03/2020   HGB 16.5 10/03/2020   HCT 48.3 10/03/2020   MCV 75.0 (L) 10/03/2020   PLT 273 10/03/2020   Lab Results  Component Value Date   NA 137 10/03/2020   K 4.1 10/03/2020   CO2 27 10/03/2020   GLUCOSE 100 (H) 10/03/2020   BUN 16 10/03/2020   CREATININE 1.26 (H) 10/03/2020   BILITOT 0.9 05/08/2020   ALKPHOS 69 05/06/2018   AST 15 05/08/2020   ALT 18 05/08/2020   PROT 7.5 05/08/2020   ALBUMIN 4.5 05/06/2018   CALCIUM 9.4 10/03/2020   ANIONGAP 7 10/03/2020   GFR 78.01 05/06/2018   Lab Results  Component  Value Date   CHOL 170 05/08/2020   Lab Results  Component Value Date   HDL 42 05/08/2020   Lab Results  Component Value Date   LDLCALC 107 (H) 05/08/2020  Lab Results  Component Value Date   TRIG 115 05/08/2020   Lab Results  Component Value Date   CHOLHDL 4.0 05/08/2020   No results found for: HGBA1C    Assessment & Plan:   Problem List Items Addressed This Visit   None   Visit Diagnoses    Atypical chest pain    -  Primary   Relevant Orders   EKG 12-Lead    Patient presents with atypical chest pain.  Doubt cardiac related.  Will obtain EKG since we could not see EKG that was done in the ER.  EKG reveals sinus bradycardia with heart rate of 50.  No acute ST-T changes.  He has some nonspecific T wave changes.  Symptoms are very atypical. We recommend observation for now.  Continue exercise unless he develops any exercise associated symptoms.  No orders of the defined types were placed in this encounter.   Follow-up: No follow-ups on file.    Evelena Peat, MD

## 2020-10-06 NOTE — Patient Instructions (Signed)

## 2021-05-21 ENCOUNTER — Other Ambulatory Visit: Payer: Self-pay

## 2021-05-22 ENCOUNTER — Ambulatory Visit (INDEPENDENT_AMBULATORY_CARE_PROVIDER_SITE_OTHER): Payer: 59 | Admitting: Family Medicine

## 2021-05-22 ENCOUNTER — Encounter: Payer: Self-pay | Admitting: Family Medicine

## 2021-05-22 VITALS — BP 132/84 | HR 62 | Temp 97.8°F | Ht 75.25 in | Wt 253.7 lb

## 2021-05-22 DIAGNOSIS — Z0000 Encounter for general adult medical examination without abnormal findings: Secondary | ICD-10-CM | POA: Diagnosis not present

## 2021-05-22 LAB — CBC WITH DIFFERENTIAL/PLATELET
Basophils Absolute: 0 10*3/uL (ref 0.0–0.1)
Basophils Relative: 0.6 % (ref 0.0–3.0)
Eosinophils Absolute: 0.1 10*3/uL (ref 0.0–0.7)
Eosinophils Relative: 1.2 % (ref 0.0–5.0)
HCT: 46.3 % (ref 39.0–52.0)
Hemoglobin: 15.5 g/dL (ref 13.0–17.0)
Lymphocytes Relative: 32.4 % (ref 12.0–46.0)
Lymphs Abs: 1.6 10*3/uL (ref 0.7–4.0)
MCHC: 33.5 g/dL (ref 30.0–36.0)
MCV: 75.9 fl — ABNORMAL LOW (ref 78.0–100.0)
Monocytes Absolute: 0.4 10*3/uL (ref 0.1–1.0)
Monocytes Relative: 8.1 % (ref 3.0–12.0)
Neutro Abs: 2.8 10*3/uL (ref 1.4–7.7)
Neutrophils Relative %: 57.7 % (ref 43.0–77.0)
Platelets: 223 10*3/uL (ref 150.0–400.0)
RBC: 6.1 Mil/uL — ABNORMAL HIGH (ref 4.22–5.81)
RDW: 13.8 % (ref 11.5–15.5)
WBC: 4.9 10*3/uL (ref 4.0–10.5)

## 2021-05-22 LAB — BASIC METABOLIC PANEL
BUN: 14 mg/dL (ref 6–23)
CO2: 25 mEq/L (ref 19–32)
Calcium: 9.7 mg/dL (ref 8.4–10.5)
Chloride: 103 mEq/L (ref 96–112)
Creatinine, Ser: 1.29 mg/dL (ref 0.40–1.50)
GFR: 70.57 mL/min (ref >60.00)
Glucose, Bld: 81 mg/dL (ref 70–99)
Potassium: 4.3 mEq/L (ref 3.5–5.1)
Sodium: 139 mEq/L (ref 135–145)

## 2021-05-22 LAB — LIPID PANEL
Cholesterol: 149 mg/dL (ref 0–200)
HDL: 44.3 mg/dL (ref >39.00)
LDL Cholesterol: 91 mg/dL (ref 0–99)
NonHDL: 104.56
Total CHOL/HDL Ratio: 3
Triglycerides: 66 mg/dL (ref 0.0–149.0)
VLDL: 13.2 mg/dL (ref 0.0–40.0)

## 2021-05-22 LAB — HEPATIC FUNCTION PANEL
ALT: 16 U/L (ref 0–53)
AST: 15 U/L (ref 0–37)
Albumin: 4.3 g/dL (ref 3.5–5.2)
Alkaline Phosphatase: 67 U/L (ref 39–117)
Bilirubin, Direct: 0.2 mg/dL (ref 0.0–0.3)
Total Bilirubin: 0.9 mg/dL (ref 0.2–1.2)
Total Protein: 7.6 g/dL (ref 6.0–8.3)

## 2021-05-22 LAB — TSH: TSH: 1.27 u[IU]/mL (ref 0.35–5.50)

## 2021-05-22 MED ORDER — ALBUTEROL SULFATE HFA 108 (90 BASE) MCG/ACT IN AERS: 2.0000 | INHALATION_SPRAY | RESPIRATORY_TRACT | 1 refills | Status: DC | PRN

## 2021-05-22 NOTE — Addendum Note (Signed)
Addended by: Bonnye Fava on: 05/22/2021 09:36 AM   Modules accepted: Orders

## 2021-05-22 NOTE — Progress Notes (Signed)
Established Patient Office Visit  Subjective:  Patient ID: David Cohen, male    DOB: 1981/04/07  Age: 40 y.o. MRN: [id number]  CC:  Chief Complaint  Patient presents with   Annual Exam    HPI David Cohen presents for physical exam.  He and his wife started attending a weight loss clinic last year and he is lost about 47 pounds.  Feels better overall.  Exercising regularly.  Generally very healthy.  He has mild intermittent asthma and rarely takes albuterol for that.  History of arthroscopic surgery of both knees but both knees are doing well at this time.  Health maintenance reviewed  -Tetanus due 2031 -Previous hepatitis C antibody negative -He had colonoscopy 2020 secondary to hemorrhoids.  No polyps were found.  Social history-he is married.  He has 88-1/2-year-old twins and 59-year-old.  He works in the sheriff's office and administration.  Non-smoker.  No alcohol.  Family history-mother has asthma.  Mother has hypertension history.  Father with history of hypertension hyperlipidemia.  He has 2 sisters are alive and well with no chronic medical problems.  1 brother died of self-inflicted wound.  Another brother alive and well.  Grandfather with colon cancer history  Past Medical History:  Diagnosis Date   Allergy    Asthma    Chicken pox    Headache(784.0)    Migraine     Past Surgical History:  Procedure Laterality Date   KNEE ARTHROSCOPY W/ ACL RECONSTRUCTION  2007, 2012,2018   x 3   ROOT CANAL  10/2018   WISDOM TOOTH EXTRACTION      Family History  Problem Relation Age of Onset   Asthma Mother    Hypertension Mother    Hyperlipidemia Father    Hypertension Father    Colon polyps Father    Cancer Maternal Aunt        gall bladder   Alcohol abuse Maternal Uncle    Diabetes Maternal Uncle    Alcohol abuse Paternal Uncle    Heart disease Maternal Grandmother    Stroke Maternal Grandfather    Mental illness Paternal Grandmother    Cancer Paternal  Grandfather        colon   Stroke Paternal Grandfather    Prostate cancer Paternal Grandfather    Mental illness Cousin    Colon cancer Neg Hx    Esophageal cancer Neg Hx    Inflammatory bowel disease Neg Hx    Liver disease Neg Hx    Pancreatic cancer Neg Hx    Stomach cancer Neg Hx    Rectal cancer Neg Hx     Social History   Socioeconomic History   Marital status: Married    Spouse name: Not on file   Number of children: 3   Years of education: Not on file   Highest education level: Not on file  Occupational History   Occupation: IT [employer]  Tobacco Use   Smoking status: Never   Smokeless tobacco: Never  Vaping Use   Vaping Use: Never used  Substance and Sexual Activity   Alcohol use: No   Drug use: No   Sexual activity: Not on file  Other Topics Concern   Not on file  Social History Narrative   Not on file   Social Determinants of Health   Financial Resource Strain: Not on file  Food Insecurity: Not on file  Transportation Needs: Not on file  Physical Activity: Not on file  Stress: Not on  file  Social Connections: Not on file  Intimate Partner Violence: Not on file    Outpatient Medications Prior to Visit  Medication Sig Dispense Refill   albuterol (PROVENTIL HFA;VENTOLIN HFA) 108 (90 Base) MCG/ACT inhaler Inhale 2 puffs into the lungs every 4 (four) hours as needed. 1 Inhaler 1   Facility-Administered Medications Prior to Visit  Medication Dose Route Frequency Provider Last Rate Last Admin   ipratropium-albuterol (DUONEB) 0.5-2.5 (3) MG/3ML nebulizer solution 3 mL  3 mL Nebulization Once Nafziger, Cory, NP        Allergies  Allergen Reactions   Penicillins Anaphylaxis    As a child "all cillins"    ROS Review of Systems  Constitutional:  Negative for activity change, appetite change, fatigue and fever.  HENT:  Negative for congestion, ear pain and trouble swallowing.   Eyes:  Negative for pain and visual disturbance.  Respiratory:   Negative for cough, shortness of breath and wheezing.   Cardiovascular:  Negative for chest pain and palpitations.  Gastrointestinal:  Negative for abdominal distention, abdominal pain, blood in stool, constipation, diarrhea, nausea, rectal pain and vomiting.  Endocrine: Negative for polydipsia and polyuria.  Genitourinary:  Negative for dysuria, hematuria and testicular pain.  Musculoskeletal:  Negative for arthralgias and joint swelling.  Skin:  Negative for rash.  Neurological:  Negative for dizziness, syncope and headaches.  Hematological:  Negative for adenopathy.  Psychiatric/Behavioral:  Negative for confusion and dysphoric mood.      Objective:    Physical Exam Constitutional:      General: He is not in acute distress.    Appearance: He is well-developed.  HENT:     Head: Normocephalic and atraumatic.     Right Ear: External ear normal.     Left Ear: External ear normal.  Eyes:     Conjunctiva/sclera: Conjunctivae normal.     Pupils: Pupils are equal, round, and reactive to light.  Neck:     Thyroid: No thyromegaly.  Cardiovascular:     Rate and Rhythm: Normal rate and regular rhythm.     Heart sounds: Normal heart sounds. No murmur heard. Pulmonary:     Effort: No respiratory distress.     Breath sounds: No wheezing or rales.  Abdominal:     General: Bowel sounds are normal. There is no distension.     Palpations: Abdomen is soft. There is no mass.     Tenderness: There is no abdominal tenderness. There is no guarding or rebound.  Musculoskeletal:     Cervical back: Normal range of motion and neck supple.     Right lower leg: No edema.     Left lower leg: No edema.  Lymphadenopathy:     Cervical: No cervical adenopathy.  Skin:    Findings: No rash.  Neurological:     Mental Status: He is alert and oriented to person, place, and time.     Cranial Nerves: No cranial nerve deficit.     Deep Tendon Reflexes: Reflexes normal.    BP 132/84 (BP Location: Left Arm,  Patient Position: Sitting, Cuff Size: Normal)   Pulse 62   Temp 97.8 F (36.6 C) (Oral)   Ht 6' 3.25" (1.911 m)   Wt 253 lb 11.2 oz (115.1 kg)   SpO2 98%   BMI 31.50 kg/m  Wt Readings from Last 3 Encounters:  05/22/21 253 lb 11.2 oz (115.1 kg)  10/06/20 283 lb 3.2 oz (128.5 kg)  05/08/20 285 lb 8 oz (129.5 kg)  Health Maintenance Due  Topic Date Due   HIV Screening  Never done   INFLUENZA VACCINE  05/21/2021    There are no preventive care reminders to display for this patient.  Lab Results  Component Value Date   TSH 1.15 05/08/2020   Lab Results  Component Value Date   WBC 7.0 10/03/2020   HGB 16.5 10/03/2020   HCT 48.3 10/03/2020   MCV 75.0 (L) 10/03/2020   PLT 273 10/03/2020   Lab Results  Component Value Date   NA 137 10/03/2020   K 4.1 10/03/2020   CO2 27 10/03/2020   GLUCOSE 100 (H) 10/03/2020   BUN 16 10/03/2020   CREATININE 1.26 (H) 10/03/2020   BILITOT 0.9 05/08/2020   ALKPHOS 69 05/06/2018   AST 15 05/08/2020   ALT 18 05/08/2020   PROT 7.5 05/08/2020   ALBUMIN 4.5 05/06/2018   CALCIUM 9.4 10/03/2020   ANIONGAP 7 10/03/2020   GFR 78.01 05/06/2018   Lab Results  Component Value Date   CHOL 170 05/08/2020   Lab Results  Component Value Date   HDL 42 05/08/2020   Lab Results  Component Value Date   LDLCALC 107 (H) 05/08/2020   Lab Results  Component Value Date   TRIG 115 05/08/2020   Lab Results  Component Value Date   CHOLHDL 4.0 05/08/2020   No results found for: HGBA1C    Assessment & Plan:   Problem List Items Addressed This Visit   None Visit Diagnoses     Physical exam    -  Primary   Relevant Orders   Basic metabolic panel   Lipid panel   CBC with Differential/Platelet   TSH   Hepatic function panel     Generally healthy 40 year old male.  Congratulated on weight loss.  -Consider annual flu vaccine -Obtain screening labs as above -Continue regular exercise habits. -Refill albuterol for as needed  use  Meds ordered this encounter  Medications   albuterol (VENTOLIN HFA) 108 (90 Base) MCG/ACT inhaler    Sig: Inhale 2 puffs into the lungs every 4 (four) hours as needed.    Dispense:  1 each    Refill:  1    Follow-up: No follow-ups on file.    Evelena Peat, MD

## 2021-05-26 IMAGING — CR DG CHEST 2V
2 series · 2 of 2 positions shown · non-contrast
Comparison: None.

CLINICAL DATA: Chest pain.

EXAM:
CHEST - 2 VIEW

[w chest pa]
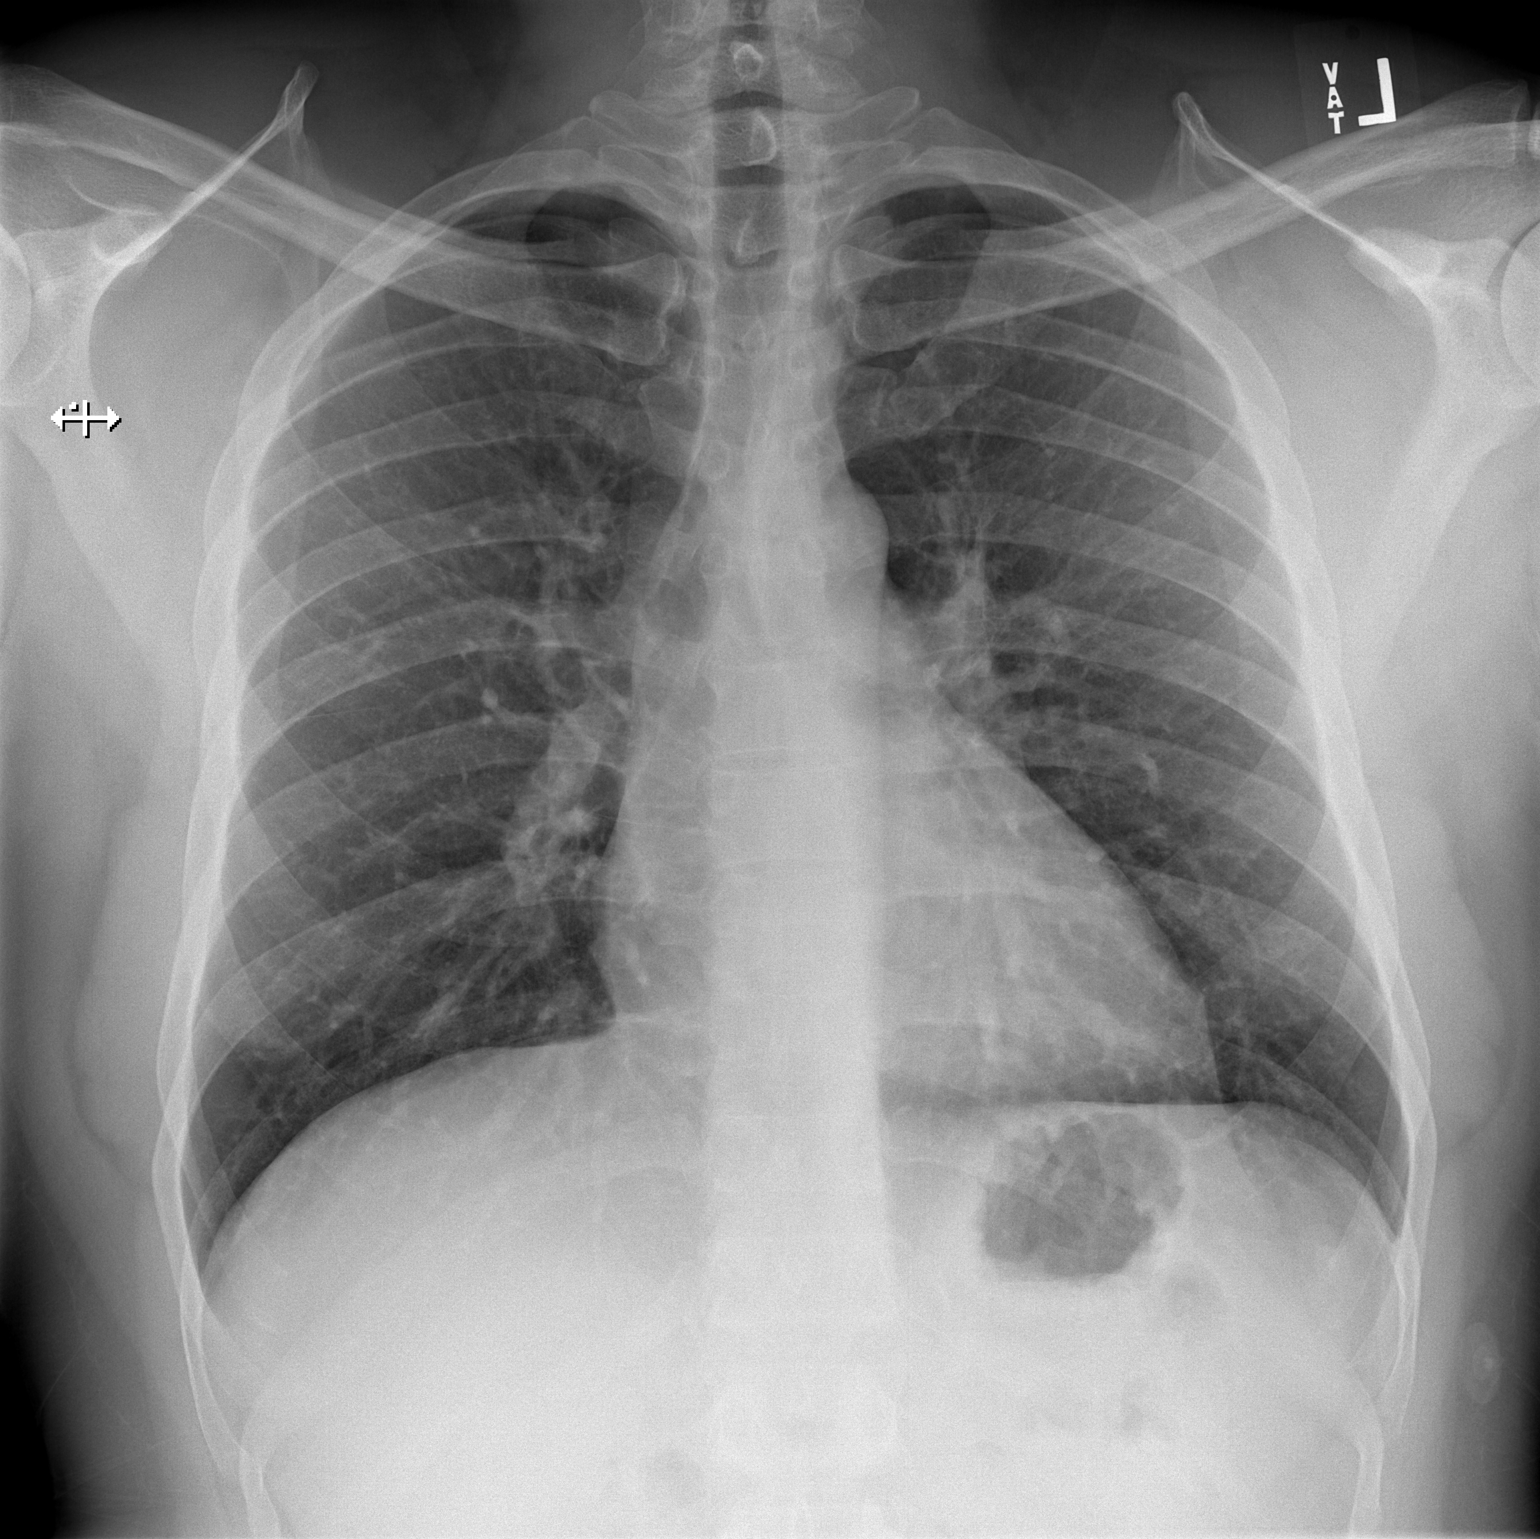

[w chest lat]
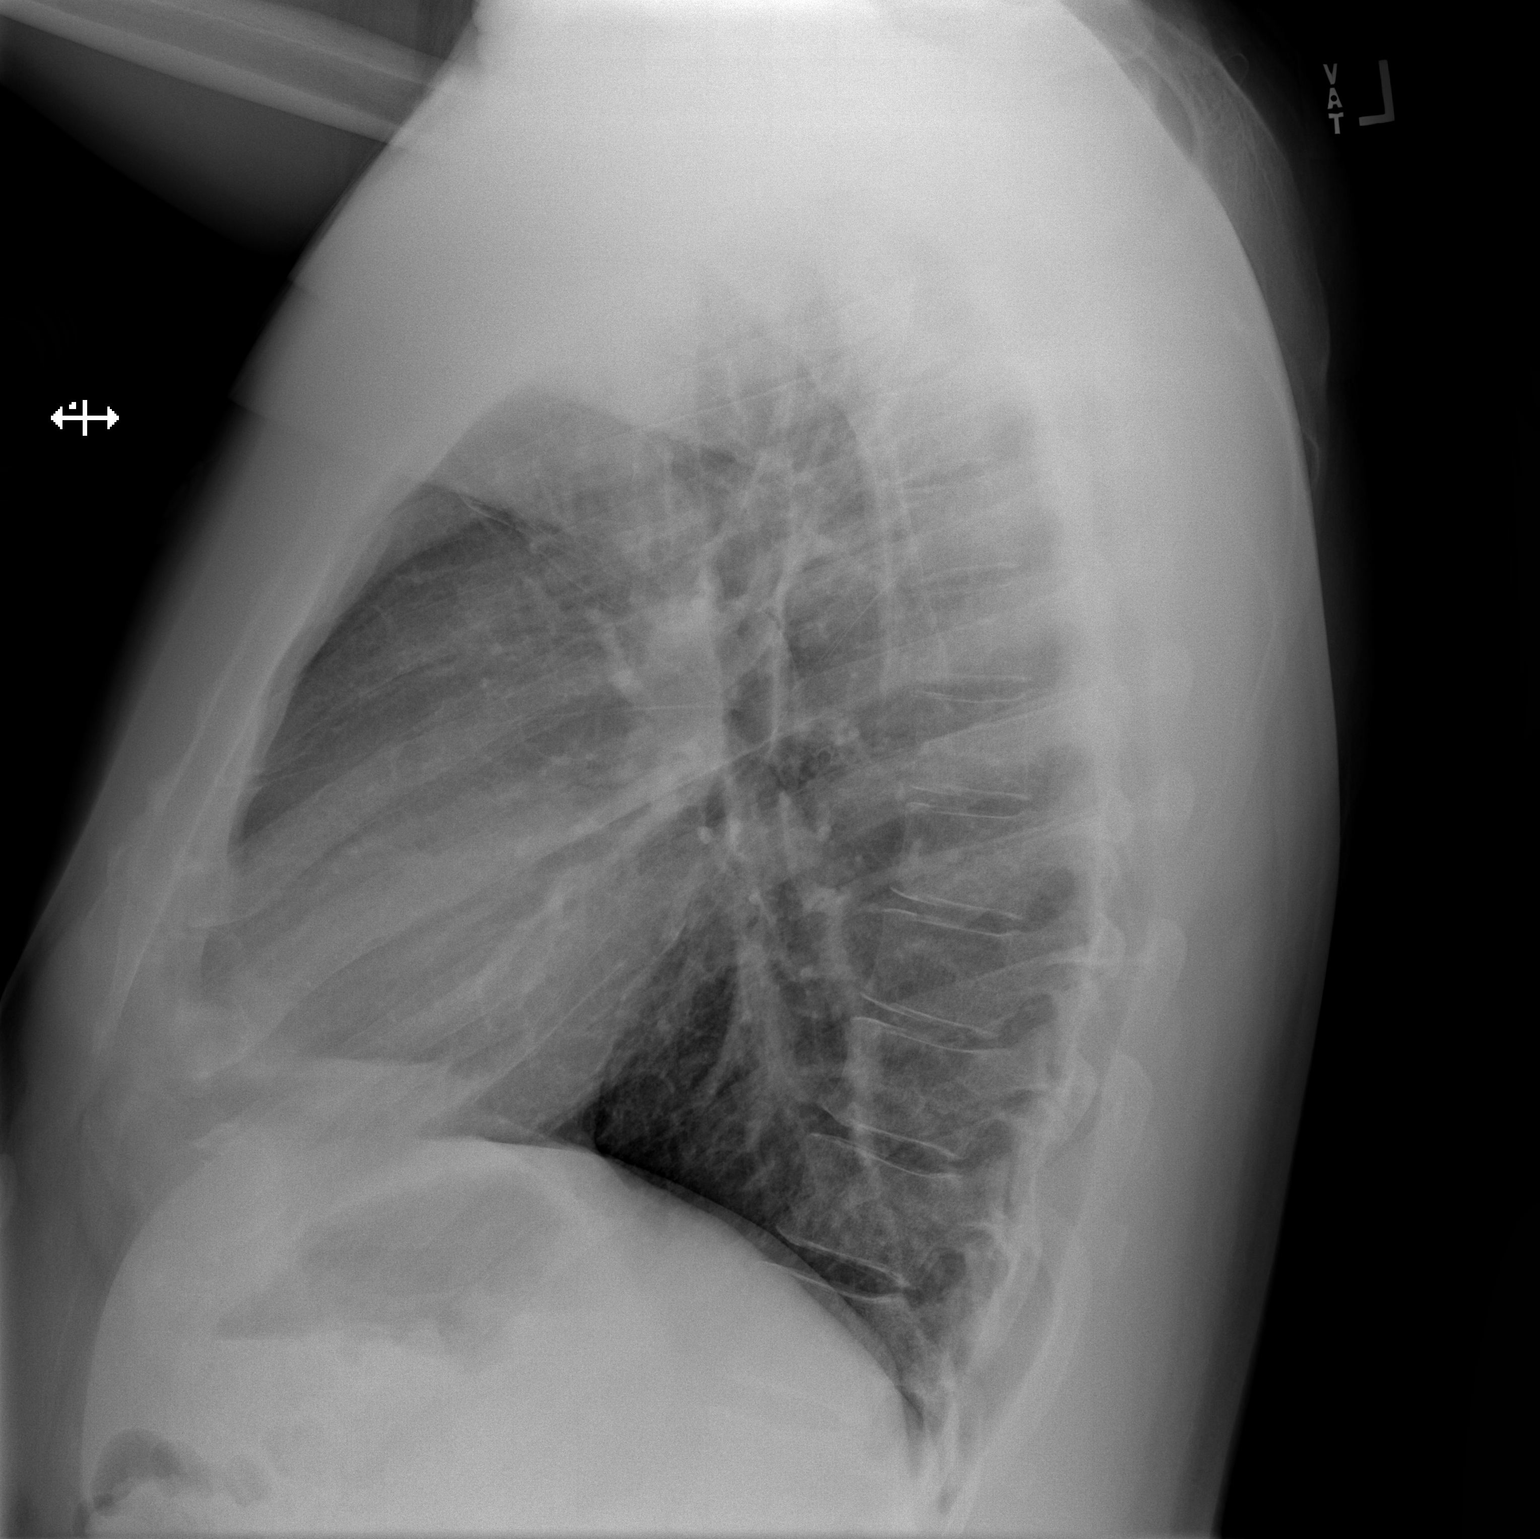

[2 of 2 positions shown; findings below may reference images not displayed]

FINDINGS: The heart size and mediastinal contours are within normal limits.
Both lungs are clear. No visible pleural effusions or pneumothorax.
No acute osseous abnormality.
IMPRESSION: No active cardiopulmonary disease.

## 2021-08-08 ENCOUNTER — Encounter (HOSPITAL_BASED_OUTPATIENT_CLINIC_OR_DEPARTMENT_OTHER): Payer: Self-pay | Admitting: Orthopedic Surgery

## 2021-08-08 DIAGNOSIS — S83282A Other tear of lateral meniscus, current injury, left knee, initial encounter: Secondary | ICD-10-CM

## 2021-08-08 DIAGNOSIS — Z9889 Other specified postprocedural states: Secondary | ICD-10-CM

## 2021-08-08 DIAGNOSIS — S83242A Other tear of medial meniscus, current injury, left knee, initial encounter: Secondary | ICD-10-CM

## 2021-08-08 HISTORY — DX: Other specified postprocedural states: Z98.890

## 2021-08-08 HISTORY — DX: Other tear of medial meniscus, current injury, left knee, initial encounter: S83.242A

## 2021-08-08 HISTORY — DX: Other tear of lateral meniscus, current injury, left knee, initial encounter: S83.282A

## 2021-08-08 NOTE — H&P (Signed)
David Cohen is an 40 y.o. male.   Chief Complaint: left knee medial and lateral meniscus tears HPI: David Cohen is a 40 year-old seen for 3-4 weeks of left knee pain with a twisting injury while playing basketball.  He has a history of bilateral ACL reconstructions.  He is now having sharp medial pain.  Left knee MRI that revealed a medial meniscus tear, as well as a small lateral meniscus tear.  His ACL graft is intact from ten years ago  Past Medical History:  Diagnosis Date   Acute lateral meniscus tear of left knee 08/08/2021   Acute medial meniscus tear of left knee 08/08/2021   Allergy    Asthma    Chicken pox    Headache(784.0)    History of reconstruction of anterior cruciate ligament tear left knee 08/08/2021   Migraine     Past Surgical History:  Procedure Laterality Date   KNEE ARTHROSCOPY W/ ACL RECONSTRUCTION  2007, 2012,2018   x 3   ROOT CANAL  10/2018   WISDOM TOOTH EXTRACTION      Family History  Problem Relation Age of Onset   Asthma Mother    Hypertension Mother    Hyperlipidemia Father    Hypertension Father    Colon polyps Father    Cancer Maternal Aunt        gall bladder   Alcohol abuse Maternal Uncle    Diabetes Maternal Uncle    Alcohol abuse Paternal Uncle    Heart disease Maternal Grandmother    Stroke Maternal Grandfather    Mental illness Paternal Grandmother    Cancer Paternal Grandfather        colon   Stroke Paternal Grandfather    Prostate cancer Paternal Grandfather    Mental illness Cousin    Colon cancer Neg Hx    Esophageal cancer Neg Hx    Inflammatory bowel disease Neg Hx    Liver disease Neg Hx    Pancreatic cancer Neg Hx    Stomach cancer Neg Hx    Rectal cancer Neg Hx    Social History:  reports that he has never smoked. He has never used smokeless tobacco. He reports that he does not drink alcohol and does not use drugs.  Allergies:  Allergies  Allergen Reactions   Penicillins Anaphylaxis    As a child "all cillins"     No medications prior to admission.    No results found for this or any previous visit (from the past 48 hour(s)). No results found.  Review of Systems  Constitutional: Negative.   HENT: Negative.    Eyes: Negative.   Respiratory: Negative.    Cardiovascular: Negative.   Gastrointestinal: Negative.   Endocrine: Negative.   Genitourinary: Negative.   Musculoskeletal:  Positive for arthralgias, gait problem and joint swelling.  Allergic/Immunologic: Negative.   Hematological: Negative.   Psychiatric/Behavioral: Negative.     There were no vitals taken for this visit. Physical Exam Constitutional:      Appearance: Normal appearance.  HENT:     Head: Normocephalic and atraumatic.     Right Ear: External ear normal.     Left Ear: External ear normal.     Nose: Nose normal.     Mouth/Throat:     Mouth: Mucous membranes are moist.  Eyes:     Conjunctiva/sclera: Conjunctivae normal.  Cardiovascular:     Rate and Rhythm: Normal rate.  Pulmonary:     Effort: Pulmonary effort is normal.  Abdominal:  Palpations: Abdomen is soft.  Genitourinary:    Comments: Not pertinent to current symptomatology therefore not examined.  Musculoskeletal:     Cervical back: Neck supple.     Comments: Examination of his left knee reveals well healed ACL incisions.  Pain medially.  Positive medial McMurray.  1+ effusion.  Range of motion 0-125 degrees.  Knee is stable with normal patella tracking  Skin:    General: Skin is warm and dry.     Capillary Refill: Capillary refill takes less than 2 seconds.  Neurological:     General: No focal deficit present.     Mental Status: He is alert.  Psychiatric:        Mood and Affect: Mood normal.        Behavior: Behavior normal.     Assessment Principal Problem:   Acute medial meniscus tear of left knee Active Problems:   History of reconstruction of anterior cruciate ligament tear left knee   Acute lateral meniscus tear of left knee    Plan I spoke to [facility] concerning his left knee MRI that revealed a medial meniscus tear, as well as a small lateral meniscus tear.  His ACL graft is intact from ten years ago.  He continues to have significant sharp catching and locking pain medially and laterally.  I have told him with these findings would recommend that we proceed with left knee arthroscopy with partial medial and lateral meniscectomies.  Risks, complications and benefits of the surgery have been described to him in detail and he understands this completely.    Emmanuella Mirante J Gram Siedlecki, PA-C 08/08/2021, 2:49 PM

## 2021-08-16 ENCOUNTER — Encounter (HOSPITAL_BASED_OUTPATIENT_CLINIC_OR_DEPARTMENT_OTHER): Payer: Self-pay | Admitting: Orthopedic Surgery

## 2021-08-16 ENCOUNTER — Other Ambulatory Visit: Payer: Self-pay

## 2021-08-20 NOTE — Progress Notes (Signed)

## 2021-08-27 ENCOUNTER — Other Ambulatory Visit: Payer: Self-pay

## 2021-08-27 ENCOUNTER — Ambulatory Visit (HOSPITAL_BASED_OUTPATIENT_CLINIC_OR_DEPARTMENT_OTHER): Payer: 59 | Admitting: Anesthesiology

## 2021-08-27 ENCOUNTER — Encounter (HOSPITAL_BASED_OUTPATIENT_CLINIC_OR_DEPARTMENT_OTHER): Payer: Self-pay | Admitting: Orthopedic Surgery

## 2021-08-27 ENCOUNTER — Encounter (HOSPITAL_BASED_OUTPATIENT_CLINIC_OR_DEPARTMENT_OTHER)
Admission: RE | Disposition: A | Discharge status: Home / Self Care | Payer: Self-pay | Source: Home / Self Care | Attending: Orthopedic Surgery

## 2021-08-27 ENCOUNTER — Ambulatory Visit (HOSPITAL_BASED_OUTPATIENT_CLINIC_OR_DEPARTMENT_OTHER)
Admission: RE | Admit: 2021-08-27 | Discharge: 2021-08-27 | Disposition: A | Discharge status: Home / Self Care | Payer: 59 | Attending: Orthopedic Surgery | Admitting: Orthopedic Surgery

## 2021-08-27 DIAGNOSIS — S83242A Other tear of medial meniscus, current injury, left knee, initial encounter: Secondary | ICD-10-CM | POA: Diagnosis present

## 2021-08-27 DIAGNOSIS — X58XXXA Exposure to other specified factors, initial encounter: Secondary | ICD-10-CM | POA: Diagnosis not present

## 2021-08-27 DIAGNOSIS — Y9367 Activity, basketball: Secondary | ICD-10-CM | POA: Insufficient documentation

## 2021-08-27 DIAGNOSIS — S83282A Other tear of lateral meniscus, current injury, left knee, initial encounter: Secondary | ICD-10-CM | POA: Diagnosis present

## 2021-08-27 DIAGNOSIS — M94262 Chondromalacia, left knee: Secondary | ICD-10-CM | POA: Insufficient documentation

## 2021-08-27 DIAGNOSIS — Z9889 Other specified postprocedural states: Secondary | ICD-10-CM

## 2021-08-27 HISTORY — PX: KNEE ARTHROSCOPY WITH LATERAL MENISECTOMY: SHX6193

## 2021-08-27 HISTORY — DX: Other complications of anesthesia, initial encounter: T88.59XA

## 2021-08-27 HISTORY — PX: KNEE ARTHROSCOPY WITH MEDIAL MENISECTOMY: SHX5651

## 2021-08-27 SURGERY — ARTHROSCOPY, KNEE, WITH LATERAL MENISCECTOMY
Anesthesia: General | Site: Knee | Laterality: Left

## 2021-08-27 MED ORDER — LACTATED RINGERS IV SOLN: INTRAVENOUS | Status: DC

## 2021-08-27 MED ORDER — LIDOCAINE 2% (20 MG/ML) 5 ML SYRINGE
INTRAMUSCULAR | Status: AC
  Filled 2021-08-27: qty 5

## 2021-08-27 MED ORDER — BUPIVACAINE-EPINEPHRINE 0.25% -1:200000 IJ SOLN
INTRAMUSCULAR | Status: DC | PRN
  Administered 2021-08-27: 20 mL
  Administered 2021-08-27: 10 mL

## 2021-08-27 MED ORDER — BUPIVACAINE-EPINEPHRINE (PF) 0.25% -1:200000 IJ SOLN
INTRAMUSCULAR | Status: AC
  Filled 2021-08-27: qty 30

## 2021-08-27 MED ORDER — PROPOFOL 10 MG/ML IV BOLUS
INTRAVENOUS | Status: AC
  Filled 2021-08-27: qty 20

## 2021-08-27 MED ORDER — LACTATED RINGERS IV SOLN
INTRAVENOUS | Status: DC
Start: 2021-08-27 — End: 2021-08-27

## 2021-08-27 MED ORDER — MIDAZOLAM HCL 5 MG/5ML IJ SOLN
INTRAMUSCULAR | Status: DC | PRN
  Administered 2021-08-27: 2 mg via INTRAVENOUS

## 2021-08-27 MED ORDER — FENTANYL CITRATE (PF) 100 MCG/2ML IJ SOLN
INTRAMUSCULAR | Status: AC
  Filled 2021-08-27: qty 2

## 2021-08-27 MED ORDER — OXYCODONE HCL 5 MG/5ML PO SOLN: 5.0000 mg | Freq: Once | ORAL | Status: AC | PRN

## 2021-08-27 MED ORDER — DEXAMETHASONE SODIUM PHOSPHATE 10 MG/ML IJ SOLN
8.0000 mg | Freq: Once | INTRAMUSCULAR | Status: AC
  Administered 2021-08-27: 8 mg via INTRAVENOUS

## 2021-08-27 MED ORDER — LIDOCAINE HCL (CARDIAC) PF 100 MG/5ML IV SOSY
PREFILLED_SYRINGE | INTRAVENOUS | Status: DC | PRN
  Administered 2021-08-27: 60 mg via INTRATRACHEAL

## 2021-08-27 MED ORDER — FENTANYL CITRATE (PF) 100 MCG/2ML IJ SOLN
INTRAMUSCULAR | Status: DC | PRN
  Administered 2021-08-27: 50 ug via INTRAVENOUS

## 2021-08-27 MED ORDER — VANCOMYCIN HCL IN DEXTROSE 500-5 MG/100ML-% IV SOLN
INTRAVENOUS | Status: AC
  Filled 2021-08-27: qty 100

## 2021-08-27 MED ORDER — ONDANSETRON HCL 4 MG/2ML IJ SOLN: 4.0000 mg | Freq: Four times a day (QID) | INTRAMUSCULAR | Status: DC | PRN

## 2021-08-27 MED ORDER — FENTANYL CITRATE (PF) 100 MCG/2ML IJ SOLN
25.0000 ug | INTRAMUSCULAR | Status: DC | PRN
  Administered 2021-08-27: 50 ug via INTRAVENOUS

## 2021-08-27 MED ORDER — SODIUM CHLORIDE 0.9 % IR SOLN
Status: DC | PRN
  Administered 2021-08-27: 3000 mL

## 2021-08-27 MED ORDER — POVIDONE-IODINE 7.5 % EX SOLN: Freq: Once | CUTANEOUS | Status: DC

## 2021-08-27 MED ORDER — VANCOMYCIN HCL IN DEXTROSE 1-5 GM/200ML-% IV SOLN
INTRAVENOUS | Status: AC
  Filled 2021-08-27: qty 200

## 2021-08-27 MED ORDER — HYDROCODONE-ACETAMINOPHEN 5-325 MG PO TABS
1.0000 | ORAL_TABLET | ORAL | 0 refills | Status: AC | PRN
Start: 2021-08-27 — End: 2022-08-27

## 2021-08-27 MED ORDER — METHYLPREDNISOLONE ACETATE 80 MG/ML IJ SUSP
INTRAMUSCULAR | Status: AC
  Filled 2021-08-27: qty 1

## 2021-08-27 MED ORDER — EPINEPHRINE PF 1 MG/ML IJ SOLN
INTRAMUSCULAR | Status: AC
  Filled 2021-08-27: qty 2

## 2021-08-27 MED ORDER — ONDANSETRON HCL 4 MG/2ML IJ SOLN
INTRAMUSCULAR | Status: AC
  Filled 2021-08-27: qty 2

## 2021-08-27 MED ORDER — PROPOFOL 10 MG/ML IV BOLUS
INTRAVENOUS | Status: DC | PRN
  Administered 2021-08-27: 360 mg via INTRAVENOUS

## 2021-08-27 MED ORDER — ONDANSETRON HCL 4 MG/2ML IJ SOLN
INTRAMUSCULAR | Status: DC | PRN
  Administered 2021-08-27: 4 mg via INTRAVENOUS

## 2021-08-27 MED ORDER — OXYCODONE HCL 5 MG PO TABS
ORAL_TABLET | ORAL | Status: AC
  Filled 2021-08-27: qty 1

## 2021-08-27 MED ORDER — POVIDONE-IODINE 10 % EX SWAB: 2.0000 "application " | Freq: Once | CUTANEOUS | Status: DC

## 2021-08-27 MED ORDER — MIDAZOLAM HCL 2 MG/2ML IJ SOLN
INTRAMUSCULAR | Status: AC
  Filled 2021-08-27: qty 2

## 2021-08-27 MED ORDER — VANCOMYCIN HCL 1500 MG/300ML IV SOLN
1500.0000 mg | INTRAVENOUS | Status: AC
  Administered 2021-08-27: 1500 mg via INTRAVENOUS

## 2021-08-27 MED ORDER — METHYLPREDNISOLONE ACETATE 80 MG/ML IJ SUSP
INTRAMUSCULAR | Status: DC | PRN
  Administered 2021-08-27: 80 mg

## 2021-08-27 MED ORDER — OXYCODONE HCL 5 MG PO TABS
5.0000 mg | ORAL_TABLET | Freq: Once | ORAL | Status: AC | PRN
  Administered 2021-08-27: 5 mg via ORAL

## 2021-08-27 MED ORDER — DEXAMETHASONE SODIUM PHOSPHATE 10 MG/ML IJ SOLN
INTRAMUSCULAR | Status: AC
  Filled 2021-08-27: qty 1

## 2021-08-27 SURGICAL SUPPLY — 45 items
BLADE EXCALIBUR 4.0X13 (MISCELLANEOUS) IMPLANT
BLADE SURG 15 STRL LF DISP TIS (BLADE) IMPLANT
BLADE SURG 15 STRL SS (BLADE)
BNDG COHESIVE 4X5 TAN ST LF (GAUZE/BANDAGES/DRESSINGS) IMPLANT
BNDG ELASTIC 6X5.8 VLCR STR LF (GAUZE/BANDAGES/DRESSINGS) ×2 IMPLANT
DISSECTOR  3.8MM X 13CM (MISCELLANEOUS) ×2
DISSECTOR 3.8MM X 13CM (MISCELLANEOUS) ×1 IMPLANT
DISSECTOR 4.0MM X 13CM (MISCELLANEOUS) IMPLANT
DRAPE ARTHROSCOPY W/POUCH 90 (DRAPES) ×2 IMPLANT
DURAPREP 26ML APPLICATOR (WOUND CARE) ×2 IMPLANT
EXCALIBUR 3.8MM X 13CM (MISCELLANEOUS) IMPLANT
GAUZE SPONGE 4X4 12PLY STRL (GAUZE/BANDAGES/DRESSINGS) ×2 IMPLANT
GAUZE XEROFORM 1X8 LF (GAUZE/BANDAGES/DRESSINGS) ×2 IMPLANT
GLOVE SURG ENC MOIS LTX SZ7 (GLOVE) ×2 IMPLANT
GLOVE SURG MICRO LTX SZ7.5 (GLOVE) ×2 IMPLANT
GLOVE SURG POLYISO LF SZ6.5 (GLOVE) ×1 IMPLANT
GLOVE SURG UNDER POLY LF SZ6.5 (GLOVE) ×1 IMPLANT
GLOVE SURG UNDER POLY LF SZ7 (GLOVE) ×3 IMPLANT
GLOVE SURG UNDER POLY LF SZ7.5 (GLOVE) ×2 IMPLANT
GOWN STRL REUS W/ TWL LRG LVL3 (GOWN DISPOSABLE) ×2 IMPLANT
GOWN STRL REUS W/ TWL XL LVL3 (GOWN DISPOSABLE) ×1 IMPLANT
GOWN STRL REUS W/TWL LRG LVL3 (GOWN DISPOSABLE) ×4
GOWN STRL REUS W/TWL XL LVL3 (GOWN DISPOSABLE) ×4
HOLDER KNEE FOAM BLUE (MISCELLANEOUS) ×2 IMPLANT
MANIFOLD NEPTUNE II (INSTRUMENTS) ×2 IMPLANT
NDL SAFETY ECLIPSE 18X1.5 (NEEDLE) ×2 IMPLANT
NEEDLE HYPO 18GX1.5 SHARP (NEEDLE)
NEEDLE HYPO 22GX1.5 SAFETY (NEEDLE) ×2 IMPLANT
PACK ARTHROSCOPY DSU (CUSTOM PROCEDURE TRAY) ×2 IMPLANT
PACK BASIN DAY SURGERY FS (CUSTOM PROCEDURE TRAY) ×2 IMPLANT
PAD ALCOHOL SWAB (MISCELLANEOUS) ×1 IMPLANT
PENCIL SMOKE EVACUATOR (MISCELLANEOUS) ×1 IMPLANT
PORT APPOLLO RF 90DEGREE MULTI (SURGICAL WAND) IMPLANT
SUCTION FRAZIER HANDLE 10FR (MISCELLANEOUS)
SUCTION TUBE FRAZIER 10FR DISP (MISCELLANEOUS) IMPLANT
SUT ETHILON 4 0 PS 2 18 (SUTURE) ×2 IMPLANT
SUT PROLENE 3 0 PS 2 (SUTURE) IMPLANT
SUT VIC AB 3-0 PS1 18 (SUTURE)
SUT VIC AB 3-0 PS1 18XBRD (SUTURE) IMPLANT
SYR 20ML LL LF (SYRINGE) IMPLANT
SYR 5ML LL (SYRINGE) ×2 IMPLANT
TOWEL GREEN STERILE FF (TOWEL DISPOSABLE) ×2 IMPLANT
TUBING ARTHROSCOPY IRRIG 16FT (MISCELLANEOUS) ×2 IMPLANT
WATER STERILE IRR 1000ML POUR (IV SOLUTION) ×1 IMPLANT
WRAP KNEE MAXI GEL POST OP (GAUZE/BANDAGES/DRESSINGS) ×2 IMPLANT

## 2021-08-27 NOTE — Transfer of Care (Signed)
Immediate Anesthesia Transfer of Care Note  Patient: David Cohen  Procedure(s) Performed: KNEE ARTHROSCOPY WITH LATERAL MENISECTOMY (Left: Knee) KNEE ARTHROSCOPY WITH MEDIAL MENISECTOMY (Left: Knee)  Patient Location: PACU  Anesthesia Type:General  Level of Consciousness: drowsy, patient cooperative and responds to stimulation  Airway & Oxygen Therapy: Patient Spontanous Breathing and Patient connected to face mask oxygen  Post-op Assessment: Report given to RN and Post -op Vital signs reviewed and stable  Post vital signs: Reviewed and stable  Last Vitals:  Vitals Value Taken Time  BP    Temp    Pulse 66 08/27/21 1141  Resp    SpO2 99 % 08/27/21 1141  Vitals shown include unvalidated device data.  Last Pain:  Vitals:   08/27/21 0923  TempSrc: Oral  PainSc: 8       Patients Stated Pain Goal: 3 (08/27/21 8250)  Complications: No notable events documented.

## 2021-08-27 NOTE — Anesthesia Preprocedure Evaluation (Addendum)
Anesthesia Evaluation  Patient identified by MRN, date of birth, ID band Patient awake    Reviewed: Allergy & Precautions, H&P , NPO status , Patient's Chart, lab work & pertinent test results  Airway Mallampati: II   Neck ROM: full    Dental   Pulmonary asthma ,    breath sounds clear to auscultation       Cardiovascular negative cardio ROS   Rhythm:regular Rate:Normal     Neuro/Psych  Headaches,    GI/Hepatic   Endo/Other    Renal/GU      Musculoskeletal   Abdominal   Peds  Hematology   Anesthesia Other Findings   Reproductive/Obstetrics                             Anesthesia Physical Anesthesia Plan  ASA: 2  Anesthesia Plan: General   Post-op Pain Management:    Induction: Intravenous  PONV Risk Score and Plan: 2 and Ondansetron, Dexamethasone, Midazolam and Treatment may vary due to age or medical condition  Airway Management Planned: LMA  Additional Equipment:   Intra-op Plan:   Post-operative Plan: Extubation in OR  Informed Consent: I have reviewed the patients History and Physical, chart, labs and discussed the procedure including the risks, benefits and alternatives for the proposed anesthesia with the patient or authorized representative who has indicated his/her understanding and acceptance.     Dental advisory given  Plan Discussed with: CRNA, Anesthesiologist and Surgeon  Anesthesia Plan Comments:         Anesthesia Quick Evaluation

## 2021-08-27 NOTE — Discharge Instructions (Signed)

## 2021-08-27 NOTE — Anesthesia Procedure Notes (Signed)
Procedure Name: LMA Insertion Date/Time: 08/27/2021 11:00 AM Performed by: Thornell Mule, CRNA Pre-anesthesia Checklist: Patient identified, Emergency Drugs available, Suction available and Patient being monitored Patient Re-evaluated:Patient Re-evaluated prior to induction Oxygen Delivery Method: Circle system utilized Preoxygenation: Pre-oxygenation with 100% oxygen Induction Type: IV induction Ventilation: Mask ventilation without difficulty LMA: LMA inserted LMA Size: 5.0 Number of attempts: 1 Placement Confirmation: positive ETCO2 Tube secured with: Tape Dental Injury: Teeth and Oropharynx as per pre-operative assessment

## 2021-08-27 NOTE — Interval H&P Note (Signed)
History and Physical Interval Note:  08/27/2021 10:50 AM  David Cohen  has presented today for surgery, with the diagnosis of left knee medial and lateral meniscus injury.  The various methods of treatment have been discussed with the patient and family. After consideration of risks, benefits and other options for treatment, the patient has consented to  Procedure(s): KNEE ARTHROSCOPY WITH LATERAL MENISECTOMY (Left) KNEE ARTHROSCOPY WITH MEDIAL MENISECTOMY (Left) as a surgical intervention.  The patient's history has been reviewed, patient examined, no change in status, stable for surgery.  I have reviewed the patient's chart and labs.  Questions were answered to the patient's satisfaction.     Nilda Simmer

## 2021-08-28 NOTE — Anesthesia Postprocedure Evaluation (Signed)
Anesthesia Post Note  Patient: David Cohen  Procedure(s) Performed: LEFT KNEE ARTHROSCOPY WITH LATERAL MENISECTOMY (Left: Knee) LEFT KNEE ARTHROSCOPY WITH MEDIAL MENISECTOMY (Left: Knee)     Patient location during evaluation: PACU Anesthesia Type: General Level of consciousness: awake and alert Pain management: pain level controlled Vital Signs Assessment: post-procedure vital signs reviewed and stable Respiratory status: spontaneous breathing, nonlabored ventilation, respiratory function stable and patient connected to nasal cannula oxygen Cardiovascular status: blood pressure returned to baseline and stable Postop Assessment: no apparent nausea or vomiting Anesthetic complications: no   No notable events documented.  Last Vitals:  Vitals:   08/27/21 1300 08/27/21 1315  BP: (!) 135/91 (!) 134/98  Pulse: (!) 52 (!) 52  Resp: (!) 9 16  Temp:  36.6 C  SpO2: 99% 98%    Last Pain:  Vitals:   08/27/21 1315  TempSrc:   PainSc: 6                  Railynn Ballo S

## 2021-08-28 NOTE — Op Note (Signed)
NAMEELKIN, Cohen MEDICAL RECORD NO: [id number] ACCOUNT NO: [id number] DATE OF BIRTH: [birth year] FACILITY: [facility] LOCATION: [facility] PHYSICIAN: Elana Alm. Thurston Hole, MD  Operative Report   DATE OF PROCEDURE: 08/27/2021  PREOPERATIVE DIAGNOSES: 1.  Left knee acute traumatic medial and lateral meniscal tears. 2.  Left knee chondromalacia.  POSTOPERATIVE DIAGNOSES: 1.  Left knee acute traumatic medial and lateral meniscal tears. 2.  Left knee chondromalacia.  PROCEDURE: 1.  Left knee EUA followed by arthroscopic partial medial and lateral meniscectomies. 2.  Left knee chondroplasty.  SURGEON:  Elana Alm. Thurston Hole, MD.  ASSISTANTJulien Girt, PA  ANESTHESIA:  General.  OPERATIVE TIME:  30 minutes.  COMPLICATIONS:  None.  INDICATIONS FOR PROCEDURE:  The patient is a 40 year old who has had repetitive twisting injuries to his left knee over the past 3-4 months.  Exam, x-rays and MRI revealed medial and lateral meniscal tears with chondromalacia.  He has failed conservative  care and is now to undergo arthroscopy.  DESCRIPTION OF PROCEDURE:  The patient is brought to the operating room on 08/27/2021 and placed on the operating table in supine position.  After being placed under general anesthesia, his left knee was examined.  He had full range of motion.  He was  stable ligamentous exam with normal patellar tracking.  The knee was sterilely injected with 0.25% Marcaine with epinephrine.  Left leg was prepped in usual sterile DuraPrep and draped using sterile technique.  Timeout procedure was called and the  correct left knee identified. Initially through an anterolateral portal the arthroscope with a pump attached was placed in through an anteromedial portal, an arthroscopic probe was placed.  On initial inspection, the medial compartment, he had 20%, grade  3 chondromalacia, which was debrided.  Medial meniscus showed tearing of the posterior medial horn, which was 30-40% was  resected back to a stable rim.  His ACL graft was intact and stable.  PCL was stable.  Lateral compartment was inspected.  He had  30% -40% grade 3 chondromalacia, which was debrided.  Lateral meniscus tear 25-30%, posterior lateral horn, which was resected back to a stable rim.  The patellofemoral joint showed 50%, grade 3 chondromalacia, which was debrided.  The patella tracked  normally.  Medial and lateral gutters were free of pathology.  At this point, I felt that all pathology had been satisfactorily addressed.  The instruments were removed.  Portals were closed with 3-0 nylon suture and the knee injected with 80 mg of  Depo-Medrol and 10 mL of Marcaine with epinephrine.  Sterile dressings were applied and the patient was awakened and taken to recovery room in stable condition.  FOLLOW UP CARE:  The patient to be followed as an outpatient on Norco for pain.  I will see him back in the office in a week for sutures out and follow up.   PUS D: 08/28/2021 11:00:52 am T: 08/28/2021 11:43:00 am  JOB: [id number]/ [id number]

## 2023-05-14 ENCOUNTER — Ambulatory Visit (INDEPENDENT_AMBULATORY_CARE_PROVIDER_SITE_OTHER): Payer: 59 | Admitting: Family Medicine

## 2023-05-14 ENCOUNTER — Encounter: Payer: Self-pay | Admitting: Family Medicine

## 2023-05-14 VITALS — BP 112/76 | HR 60 | Temp 98.0°F | Ht 75.2 in | Wt 277.2 lb

## 2023-05-14 DIAGNOSIS — Z0000 Encounter for general adult medical examination without abnormal findings: Secondary | ICD-10-CM

## 2023-05-14 LAB — BASIC METABOLIC PANEL
BUN: 14 mg/dL (ref 6–23)
CO2: 25 mEq/L (ref 19–32)
Calcium: 10 mg/dL (ref 8.4–10.5)
Chloride: 103 mEq/L (ref 96–112)
Creatinine, Ser: 1.33 mg/dL (ref 0.40–1.50)
GFR: 67.09 mL/min (ref >60.00)
Glucose, Bld: 85 mg/dL (ref 70–99)
Potassium: 4.5 mEq/L (ref 3.5–5.1)
Sodium: 138 mEq/L (ref 135–145)

## 2023-05-14 LAB — HEPATIC FUNCTION PANEL
ALT: 16 U/L (ref 0–53)
AST: 15 U/L (ref 0–37)
Albumin: 4.4 g/dL (ref 3.5–5.2)
Alkaline Phosphatase: 66 U/L (ref 39–117)
Bilirubin, Direct: 0.1 mg/dL (ref 0.0–0.3)
Total Bilirubin: 0.7 mg/dL (ref 0.2–1.2)
Total Protein: 7.7 g/dL (ref 6.0–8.3)

## 2023-05-14 LAB — CBC WITH DIFFERENTIAL/PLATELET
Basophils Absolute: 0 10*3/uL (ref 0.0–0.1)
Basophils Relative: 0.9 % (ref 0.0–3.0)
Eosinophils Absolute: 0.1 10*3/uL (ref 0.0–0.7)
Eosinophils Relative: 2 % (ref 0.0–5.0)
HCT: 49.7 % (ref 39.0–52.0)
Hemoglobin: 15.8 g/dL (ref 13.0–17.0)
Lymphocytes Relative: 35.1 % (ref 12.0–46.0)
Lymphs Abs: 1.8 10*3/uL (ref 0.7–4.0)
MCHC: 31.9 g/dL (ref 30.0–36.0)
MCV: 78 fl (ref 78.0–100.0)
Monocytes Absolute: 0.4 10*3/uL (ref 0.1–1.0)
Monocytes Relative: 7.1 % (ref 3.0–12.0)
Neutro Abs: 2.8 10*3/uL (ref 1.4–7.7)
Neutrophils Relative %: 54.9 % (ref 43.0–77.0)
Platelets: 249 10*3/uL (ref 150.0–400.0)
RBC: 6.37 Mil/uL — ABNORMAL HIGH (ref 4.22–5.81)
RDW: 14 % (ref 11.5–15.5)
WBC: 5.1 10*3/uL (ref 4.0–10.5)

## 2023-05-14 LAB — LIPID PANEL
Cholesterol: 191 mg/dL (ref 0–200)
HDL: 44.5 mg/dL (ref >39.00)
LDL Cholesterol: 125 mg/dL — ABNORMAL HIGH (ref 0–99)
NonHDL: 146.56
Total CHOL/HDL Ratio: 4
Triglycerides: 107 mg/dL (ref 0.0–149.0)
VLDL: 21.4 mg/dL (ref 0.0–40.0)

## 2023-05-14 LAB — PSA: PSA: 0.22 ng/mL (ref 0.10–4.00)

## 2023-05-14 MED ORDER — ALBUTEROL SULFATE HFA 108 (90 BASE) MCG/ACT IN AERS: 2.0000 | INHALATION_SPRAY | RESPIRATORY_TRACT | 1 refills | Status: DC | PRN
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
## 2023-07-23 ENCOUNTER — Other Ambulatory Visit: Payer: Self-pay | Admitting: Family Medicine

## 2024-05-26 ENCOUNTER — Encounter: Payer: Self-pay | Admitting: Family Medicine

## 2024-05-26 ENCOUNTER — Ambulatory Visit: Payer: Self-pay | Admitting: Family Medicine

## 2024-05-26 ENCOUNTER — Ambulatory Visit (INDEPENDENT_AMBULATORY_CARE_PROVIDER_SITE_OTHER): Admitting: Family Medicine

## 2024-05-26 VITALS — BP 118/80 | HR 68 | Temp 98.2°F | Ht 75.59 in | Wt 278.5 lb

## 2024-05-26 DIAGNOSIS — Z0000 Encounter for general adult medical examination without abnormal findings: Secondary | ICD-10-CM | POA: Diagnosis not present

## 2024-05-26 LAB — BASIC METABOLIC PANEL WITH GFR
BUN: 10 mg/dL (ref 6–23)
CO2: 26 meq/L (ref 19–32)
Calcium: 9.6 mg/dL (ref 8.4–10.5)
Chloride: 104 meq/L (ref 96–112)
Creatinine, Ser: 1.28 mg/dL (ref 0.40–1.50)
GFR: 69.74 mL/min (ref >60.00)
Glucose, Bld: 98 mg/dL (ref 70–99)
Potassium: 4.2 meq/L (ref 3.5–5.1)
Sodium: 138 meq/L (ref 135–145)

## 2024-05-26 LAB — LIPID PANEL
Cholesterol: 182 mg/dL (ref 0–200)
HDL: 42.7 mg/dL (ref >39.00)
LDL Cholesterol: 119 mg/dL — ABNORMAL HIGH (ref 0–99)
NonHDL: 138.93
Total CHOL/HDL Ratio: 4
Triglycerides: 99 mg/dL (ref 0.0–149.0)
VLDL: 19.8 mg/dL (ref 0.0–40.0)

## 2024-05-26 LAB — CBC WITH DIFFERENTIAL/PLATELET
Basophils Absolute: 0 K/uL (ref 0.0–0.1)
Basophils Relative: 0.5 % (ref 0.0–3.0)
Eosinophils Absolute: 0.1 K/uL (ref 0.0–0.7)
Eosinophils Relative: 1.2 % (ref 0.0–5.0)
HCT: 46.6 % (ref 39.0–52.0)
Hemoglobin: 15.3 g/dL (ref 13.0–17.0)
Lymphocytes Relative: 23.6 % (ref 12.0–46.0)
Lymphs Abs: 1.4 K/uL (ref 0.7–4.0)
MCHC: 32.8 g/dL (ref 30.0–36.0)
MCV: 75.6 fl — ABNORMAL LOW (ref 78.0–100.0)
Monocytes Absolute: 0.4 K/uL (ref 0.1–1.0)
Monocytes Relative: 6.9 % (ref 3.0–12.0)
Neutro Abs: 3.9 K/uL (ref 1.4–7.7)
Neutrophils Relative %: 67.8 % (ref 43.0–77.0)
Platelets: 269 K/uL (ref 150.0–400.0)
RBC: 6.17 Mil/uL — ABNORMAL HIGH (ref 4.22–5.81)
RDW: 14.9 % (ref 11.5–15.5)
WBC: 5.8 K/uL (ref 4.0–10.5)
nRBC: 0 /100{WBCs} (ref 0–4)

## 2024-05-26 LAB — HEMOGLOBIN A1C: Hgb A1c MFr Bld: 6.1 % (ref 4.6–6.5)

## 2024-05-26 LAB — HEPATIC FUNCTION PANEL
ALT: 19 U/L (ref 0–53)
AST: 17 U/L (ref 0–37)
Albumin: 4.4 g/dL (ref 3.5–5.2)
Alkaline Phosphatase: 60 U/L (ref 39–117)
Bilirubin, Direct: 0.1 mg/dL (ref 0.0–0.3)
Total Bilirubin: 0.9 mg/dL (ref 0.2–1.2)
Total Protein: 8 g/dL (ref 6.0–8.3)

## 2024-05-26 NOTE — Progress Notes (Signed)
 Established Patient Office Visit  Subjective   Patient ID: David Cohen, male    DOB: 03-29-1981  Age: 43 y.o. MRN: [id number]  Chief Complaint  Patient presents with   Annual Exam    HPI   David Cohen is seen for physical exam.  History of mild intermittent asthma but really no recent flareups.  Generally very healthy.  Exercises fairly regularly.  Still run some.  Also does weights few times per week.  Weight stable compared with last year at this time.  He states has some general aches and pains which he thinks are related to aging .  Takes no regular medications.  Health maintenance reviewed:  -Tetanus due 2031 -Prior hepatitis C screening negative -No significant risk factors for HIV or hepatitis B  Social history-married.  He has 16-1/2-year-old twins and 60 year old son (last year's note indicated 66 yo daughter and this was an error).  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  Never smoked.  No alcohol.  Family history-mother has asthma.  Mother has hypertension  Father with history of hypertension and hyperlipidemia.  He has 2 sisters are alive and well with no chronic medical problems.  1 brother died of self-inflicted wound.  Another brother alive and well.  Grandfather with colon cancer history--in his 41s.   Past Medical History:  Diagnosis Date   Acute lateral meniscus tear of left knee 08/08/2021   Acute medial meniscus tear of left knee 08/08/2021   Allergy    Asthma    Chicken pox    Complication of anesthesia    Per pt wakes up aggressive   Headache(784.0)    History of reconstruction of anterior cruciate ligament tear left knee 08/08/2021   Migraine    Past Surgical History:  Procedure Laterality Date   KNEE ARTHROSCOPY W/ ACL RECONSTRUCTION  2007, 2012,2018   x 3   KNEE ARTHROSCOPY WITH LATERAL MENISECTOMY Left 08/27/2021   Procedure: LEFT KNEE ARTHROSCOPY WITH LATERAL MENISECTOMY;  Surgeon: Jane Charleston, MD;  Location: [facility];  Service: Orthopedics;  Laterality: Left;   KNEE ARTHROSCOPY WITH MEDIAL MENISECTOMY Left 08/27/2021   Procedure: LEFT KNEE ARTHROSCOPY WITH MEDIAL MENISECTOMY;  Surgeon: Jane Charleston, MD;  Location: [facility];  Service: Orthopedics;  Laterality: Left;   ROOT CANAL  10/2018   WISDOM TOOTH EXTRACTION      reports that he has never smoked. He has never used smokeless tobacco. He reports that he does not drink alcohol and does not use drugs. family history includes Alcohol abuse in his maternal uncle and paternal uncle; Asthma in his mother; Cancer in his maternal aunt and paternal grandfather; Colon polyps in his father; Diabetes in his maternal uncle; Heart disease in his maternal grandmother; Hyperlipidemia in his father; Hypertension in his father and mother; Mental illness in his cousin and paternal grandmother; Prostate cancer in his paternal grandfather; Stroke in his maternal grandfather and paternal grandfather. Allergies  Allergen Reactions   Penicillins Anaphylaxis    As a child all cillins    Review of Systems  Constitutional:  Negative for chills, fever, malaise/fatigue and weight loss.  HENT:  Negative for hearing loss.   Eyes:  Negative for blurred vision and double vision.  Respiratory:  Negative for cough and shortness of breath.   Cardiovascular:  Negative for chest pain, palpitations and leg swelling.  Gastrointestinal:  Negative for abdominal pain, blood in stool, constipation and diarrhea.  Genitourinary:  Negative for dysuria.  Skin:  Negative for rash.  Neurological:  Negative for dizziness, speech change, seizures, loss of consciousness and headaches.  Psychiatric/Behavioral:  Negative for depression.       Objective:     BP 118/80   Pulse 68   Temp 98.2 F (36.8 C) (Oral)   Ht 6' 3.59 (1.92 m)   Wt 278 lb 8 oz (126.3 kg)   SpO2 96%   BMI 34.27 kg/m  BP Readings from Last 3 Encounters:  05/26/24 118/80  05/14/23 112/76   08/27/21 (!) 134/98   Wt Readings from Last 3 Encounters:  05/26/24 278 lb 8 oz (126.3 kg)  05/14/23 277 lb 3.2 oz (125.7 kg)  08/27/21 252 lb 3.3 oz (114.4 kg)      Physical Exam Vitals reviewed.  Constitutional:      General: He is not in acute distress.    Appearance: He is well-developed. He is not ill-appearing.  HENT:     Head: Normocephalic and atraumatic.     Right Ear: External ear normal.     Left Ear: External ear normal.     Mouth/Throat:     Mouth: Mucous membranes are moist.     Pharynx: Oropharynx is clear. No posterior oropharyngeal erythema.  Eyes:     Conjunctiva/sclera: Conjunctivae normal.     Pupils: Pupils are equal, round, and reactive to light.  Neck:     Thyroid : No thyromegaly.  Cardiovascular:     Rate and Rhythm: Normal rate and regular rhythm.     Heart sounds: Normal heart sounds. No murmur heard. Pulmonary:     Effort: No respiratory distress.     Breath sounds: No wheezing or rales.  Abdominal:     General: Bowel sounds are normal. There is no distension.     Palpations: Abdomen is soft. There is no mass.     Tenderness: There is no abdominal tenderness. There is no guarding or rebound.  Musculoskeletal:     Cervical back: Normal range of motion and neck supple.     Right lower leg: No edema.     Left lower leg: No edema.  Lymphadenopathy:     Cervical: No cervical adenopathy.  Skin:    Findings: No rash.  Neurological:     Mental Status: He is alert and oriented to person, place, and time.     Cranial Nerves: No cranial nerve deficit.      No results found for any visits on 05/26/24.    The 10-year ASCVD risk score (Arnett DK, et al., 2019) is: 2.9%    Assessment & Plan:   Problem List Items Addressed This Visit   None Visit Diagnoses       Physical exam    -  Primary   Relevant Orders   Basic metabolic panel with GFR   Lipid panel   CBC with Differential/Platelet   Hepatic function panel   Hemoglobin A1c      Generally healthy 43 year old male.  No chronic medical problems.  Takes no medications.  We discussed the following health maintenance items  -Discussed things like HIV screening but he is low risk - Obtain screening labs as above - Work form completed verifying his physical today - Tetanus up-to-date - Consider screening colonoscopy at age 60 - Continue regular exercise habits with minimum of 150 minutes/week of moderate intensity exercise - His current BMI is 34.  We have encouraged him to try to get his weight down below 250 pounds as a goal  No  follow-ups on file.    Wolm Scarlet, MD

## 2024-10-11 ENCOUNTER — Other Ambulatory Visit: Payer: Self-pay | Admitting: Nurse Practitioner

## 2024-10-11 ENCOUNTER — Ambulatory Visit
Admission: RE | Admit: 2024-10-11 | Discharge: 2024-10-11 | Disposition: A | Payer: Self-pay | Source: Ambulatory Visit | Attending: Nurse Practitioner

## 2024-10-11 DIAGNOSIS — R52 Pain, unspecified: Secondary | ICD-10-CM

## 2025-05-27 ENCOUNTER — Encounter: Admitting: Family Medicine
